# Patient Record
Sex: Female | Born: 1990 | Hispanic: No | Marital: Single | Smoking: Never smoker
Health system: Southern US, Community
[De-identification: ages and names within clinical notes are randomized; demographics above are authoritative.]

## PROBLEM LIST (undated history)

## (undated) ENCOUNTER — Inpatient Hospital Stay (HOSPITAL_COMMUNITY): Payer: Self-pay

## (undated) DIAGNOSIS — M419 Scoliosis, unspecified: Secondary | ICD-10-CM

## (undated) DIAGNOSIS — R030 Elevated blood-pressure reading, without diagnosis of hypertension: Secondary | ICD-10-CM

## (undated) DIAGNOSIS — Z8619 Personal history of other infectious and parasitic diseases: Secondary | ICD-10-CM

## (undated) DIAGNOSIS — O09899 Supervision of other high risk pregnancies, unspecified trimester: Secondary | ICD-10-CM

## (undated) DIAGNOSIS — Z87440 Personal history of urinary (tract) infections: Secondary | ICD-10-CM

## (undated) DIAGNOSIS — A749 Chlamydial infection, unspecified: Secondary | ICD-10-CM

## (undated) HISTORY — PX: DILATION AND CURETTAGE OF UTERUS: SHX78

---
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
## 2013-03-31 ENCOUNTER — Encounter (HOSPITAL_COMMUNITY): Payer: Self-pay | Admitting: Emergency Medicine

## 2013-03-31 ENCOUNTER — Emergency Department (INDEPENDENT_AMBULATORY_CARE_PROVIDER_SITE_OTHER)
Admission: EM | Admit: 2013-03-31 | Discharge: 2013-03-31 | Disposition: A | Discharge status: Home / Self Care | Payer: Medicaid Other | Source: Home / Self Care | Attending: Family Medicine | Admitting: Family Medicine

## 2013-03-31 DIAGNOSIS — IMO0001 Reserved for inherently not codable concepts without codable children: Secondary | ICD-10-CM

## 2013-03-31 DIAGNOSIS — L500 Allergic urticaria: Secondary | ICD-10-CM

## 2013-03-31 MED ORDER — HYDROXYZINE HCL 25 MG PO TABS: 25.0000 mg | ORAL_TABLET | Freq: Four times a day (QID) | ORAL | Status: DC

## 2013-03-31 MED ORDER — MOMETASONE FUROATE 0.1 % EX CREA: TOPICAL_CREAM | Freq: Every day | CUTANEOUS | Status: DC

## 2013-03-31 NOTE — ED Notes (Signed)
Pt c/o face swelling around eyes onset last night... Believes it may have been due to insect bite Sx include: itching Denies: difficult breathing, fevers

## 2013-03-31 NOTE — ED Provider Notes (Addendum)
History     CSN: [id number]  Arrival date & time 03/31/13  1149   First MD Initiated Contact with Patient 03/31/13 1258      Chief Complaint  Patient presents with  . Facial Swelling    (Consider location/radiation/quality/duration/timing/severity/associated sxs/prior treatment) Patient is a 22 y.o. female presenting with rash. The history is provided by the patient.  Rash Pain severity:  No pain Duration:  6 hours Progression:  Unchanged Chronicity:  Recurrent Context comment:  Over past sev weeks has had sporadic areas of rash swelling,   Past Medical History  Diagnosis Date  . Chlamydia   . Scoliosis     mild  . History of GBS (group B streptococcus) UTI, currently pregnant     History reviewed. No pertinent past surgical history.  No family history on file.  History  Substance Use Topics  . Smoking status: Never Smoker   . Smokeless tobacco: Not on file  . Alcohol Use: No    OB History   Grav Para Term Preterm Abortions TAB SAB Ect Mult Living   2 1 1  1 1    1       Review of Systems  Constitutional: Negative.   HENT: Positive for facial swelling. Negative for congestion, rhinorrhea and postnasal drip.   Eyes: Negative.   Skin: Positive for rash.    Allergies  Review of patient's allergies indicates no known allergies.  Home Medications   Current Outpatient Rx  Name  Route  Sig  Dispense  Refill  . hydrOXYzine (ATARAX/VISTARIL) 25 MG tablet   Oral   Take 1 tablet (25 mg total) by mouth every 6 (six) hours. For itching rash swelling.   20 tablet   0   . mometasone (ELOCON) 0.1 % cream   Topical   Apply topically daily.   45 g   0   . prenatal vitamin w/FE, FA (PRENATAL 1 + 1) 27-1 MG TABS   Oral   Take 1 tablet by mouth daily.             BP 129/84  Pulse 62  Temp(Src) 98.1 F (36.7 C) (Oral)  Resp 16  SpO2 100%  LMP 03/26/2013  Breastfeeding? No  Physical Exam  Nursing note and vitals reviewed. Constitutional: She is  oriented to person, place, and time. She appears well-developed and well-nourished.  HENT:  Head: Normocephalic.  Right Ear: External ear normal.  Left Ear: External ear normal.  Mouth/Throat: Oropharynx is clear and moist.  Eyes: Conjunctivae are normal. Pupils are equal, round, and reactive to light.  Neck: Normal range of motion. Neck supple.  Neurological: She is alert and oriented to person, place, and time.  Skin: Skin is warm and dry. No rash noted.  No rash swelling evident.  Psychiatric: She has a normal mood and affect.    ED Course  Procedures (including critical care time)  Labs Reviewed - No data to display No results found.   1. Allergy, urticaria       MDM          Linna Hoff, MD 03/31/13 1313  Linna Hoff, MD 03/31/13 2000

## 2013-08-02 HISTORY — PX: NASAL SINUS SURGERY: SHX719

## 2013-10-14 ENCOUNTER — Encounter (HOSPITAL_COMMUNITY): Payer: Self-pay | Admitting: Emergency Medicine

## 2013-10-14 ENCOUNTER — Emergency Department (HOSPITAL_COMMUNITY): Payer: Medicaid Other

## 2013-10-14 ENCOUNTER — Emergency Department (HOSPITAL_COMMUNITY)
Admission: EM | Admit: 2013-10-14 | Discharge: 2013-10-14 | Disposition: A | Discharge status: Home / Self Care | Payer: Medicaid Other | Attending: Emergency Medicine | Admitting: Emergency Medicine

## 2013-10-14 DIAGNOSIS — R059 Cough, unspecified: Secondary | ICD-10-CM | POA: Insufficient documentation

## 2013-10-14 DIAGNOSIS — R350 Frequency of micturition: Secondary | ICD-10-CM | POA: Insufficient documentation

## 2013-10-14 DIAGNOSIS — Z79899 Other long term (current) drug therapy: Secondary | ICD-10-CM | POA: Insufficient documentation

## 2013-10-14 DIAGNOSIS — Z8619 Personal history of other infectious and parasitic diseases: Secondary | ICD-10-CM | POA: Insufficient documentation

## 2013-10-14 DIAGNOSIS — Z8739 Personal history of other diseases of the musculoskeletal system and connective tissue: Secondary | ICD-10-CM | POA: Insufficient documentation

## 2013-10-14 DIAGNOSIS — Z87440 Personal history of urinary (tract) infections: Secondary | ICD-10-CM | POA: Insufficient documentation

## 2013-10-14 DIAGNOSIS — R05 Cough: Secondary | ICD-10-CM | POA: Insufficient documentation

## 2013-10-14 DIAGNOSIS — J029 Acute pharyngitis, unspecified: Secondary | ICD-10-CM | POA: Insufficient documentation

## 2013-10-14 MED ORDER — BENZONATATE 100 MG PO CAPS: 100.0000 mg | ORAL_CAPSULE | Freq: Three times a day (TID) | ORAL | Status: DC

## 2013-10-14 MED ORDER — LORATADINE 10 MG PO TABS: 10.0000 mg | ORAL_TABLET | Freq: Every day | ORAL | Status: DC

## 2013-10-14 NOTE — ED Notes (Signed)
Pt reports having a cough for one month. Pt states that sometimes a clear mucus will come up. Pt states the cough is worse at night and "sometimes can not stop coughing." Pt is A/O x4, in NAD, and vital signs are WDL.

## 2013-10-14 NOTE — ED Provider Notes (Signed)
CSN: [id number]     Arrival date & time 10/14/13  1154 History  This chart was scribed for non-physician practitioner working with Shanna Cisco, MD by Ashley Jacobs, ED scribe. This patient was seen in room WA10/WA10 and the patient's care was started at 12:32 PM.   First MD Initiated Contact with Patient 10/14/13 1212     Chief Complaint  Patient presents with  . Cough   (Consider location/radiation/quality/duration/timing/severity/associated sxs/prior Treatment) Patient is a 22 y.o. female presenting with cough. The history is provided by the patient and medical records. No language interpreter was used.  Cough Cough characteristics:  Dry Severity:  Mild Onset quality:  Sudden Duration:  31 days Timing:  Constant Progression:  Unchanged Chronicity:  New Smoker: no   Context: upper respiratory infection   Context: not sick contacts   Relieved by:  Nothing Ineffective treatments:  Cough suppressants Associated symptoms: sore throat   Associated symptoms: no chills, no fever and no rhinorrhea   Risk factors: recent infection   Risk factors: no recent travel    HPI Comments: Ann Parsons is a 22 y.o. female who presents to the Emergency Department complaining of a persistent dry cough for the past month. When she coughs she experiences urinary frequency.The cough is worse at night and cause her to have throat soreness. The throat soreness is described as a "burning sensation". She initially had congestion but denies the symptoms presently. Denies chest pain. Pt does not have any known allergies to medications. She has tried OTC generic cough medicine for symptoms with minimal relief. Pt denies recent travel. She denies nausea, vomiting, and diarrhea. Pt have any known allergies to medications.   Past Medical History  Diagnosis Date  . Chlamydia   . Scoliosis     mild  . History of GBS (group B streptococcus) UTI, currently pregnant    History reviewed. No pertinent past  surgical history. No family history on file. History  Substance Use Topics  . Smoking status: Never Smoker   . Smokeless tobacco: Never Used  . Alcohol Use: No   OB History   Grav Para Term Preterm Abortions TAB SAB Ect Mult Living   2 1 1  1 1    1      Review of Systems  Constitutional: Negative for fever and chills.  HENT: Positive for sore throat. Negative for congestion and rhinorrhea.   Respiratory: Positive for cough.   Gastrointestinal: Negative for nausea, vomiting and diarrhea.  All other systems reviewed and are negative.    Allergies  Review of patient's allergies indicates no known allergies.  Home Medications   Current Outpatient Rx  Name  Route  Sig  Dispense  Refill  . ibuprofen (ADVIL,MOTRIN) 200 MG tablet   Oral   Take 600 mg by mouth every 6 (six) hours as needed.         . benzonatate (TESSALON) 100 MG capsule   Oral   Take 1 capsule (100 mg total) by mouth every 8 (eight) hours.   21 capsule   0   . loratadine (CLARITIN) 10 MG tablet   Oral   Take 1 tablet (10 mg total) by mouth daily.   30 tablet   0    BP 129/70  Pulse 84  Temp(Src) 98.1 F (36.7 C) (Oral)  Resp 18  SpO2 100%  LMP 09/06/2013 Physical Exam  Nursing note and vitals reviewed. Constitutional: She appears well-developed and well-nourished. No distress.  Pt lying comfortably in  exam bed, NAD.   HENT:  Head: Normocephalic and atraumatic.  Right Ear: Hearing, tympanic membrane, external ear and ear canal normal.  Left Ear: Hearing, tympanic membrane, external ear and ear canal normal.  Nose: Nose normal.  Mouth/Throat: Uvula is midline, oropharynx is clear and moist and mucous membranes are normal.  Eyes: Conjunctivae are normal. No scleral icterus.  Neck: Normal range of motion.  Cardiovascular: Normal rate, regular rhythm and normal heart sounds.   Pulmonary/Chest: Effort normal and breath sounds normal. No respiratory distress. She has no wheezes. She has no rales.  She exhibits no tenderness.  No respiratory distress, able to speak in full sentences w/o difficulty. Lungs: CTAB   Abdominal: Soft. Bowel sounds are normal. She exhibits no distension and no mass. There is no tenderness. There is no rebound and no guarding.  Musculoskeletal: Normal range of motion.  Neurological: She is alert.  Skin: Skin is warm and dry. She is not diaphoretic.    ED Course  Procedures (including critical care time) DIAGNOSTIC STUDIES: Oxygen Saturation is 100% on room air, normal by my interpretation.    COORDINATION OF CARE: 12:36 PM Discussed course of care with pt . Pt understands and agrees.  Labs Review Labs Reviewed - No data to display Imaging Review Dg Chest 2 View  10/14/2013   CLINICAL DATA:  Shortness of breath  EXAM: CHEST  2 VIEW  COMPARISON:  None.  FINDINGS: The heart size and mediastinal contours are within normal limits. Both lungs are clear. The visualized skeletal structures are unremarkable.  IMPRESSION: No active cardiopulmonary disease.   Electronically Signed   By: Alcide Clever M.D.   On: 10/14/2013 12:25    EKG Interpretation   None       MDM   1. Cough    Pt is a [place] female with hx of URI with congestion 31mo ago c/o persistent dry cough. Denies fever, n/v/d. Pt is not on daily medications such as an ACEi.  Vitals: WNL, O2-100%.  Denies chest pain.  CXR: unremarkable, no signs of pneumonia or bronchitis. PERC negative.  Not concerned for ACS. Rx: tessalon pearls, advised to f/u with PCP next week if not improving. Return precautions provided. Pt verbalized understanding and agreement with tx plan.   I personally performed the services described in this documentation, which was scribed in my presence. The recorded information has been reviewed and is accurate.    Junius Finner, PA-C 10/14/13 1311

## 2013-10-14 NOTE — ED Provider Notes (Signed)
Medical screening examination/treatment/procedure(s) were performed by non-physician practitioner and as supervising physician I was immediately available for consultation/collaboration.  Shanna Cisco, MD 10/14/13 [phone]

## 2013-11-26 ENCOUNTER — Emergency Department (HOSPITAL_COMMUNITY): Payer: Medicaid Other

## 2013-11-26 ENCOUNTER — Encounter (HOSPITAL_COMMUNITY): Payer: Self-pay | Admitting: Emergency Medicine

## 2013-11-26 ENCOUNTER — Emergency Department (HOSPITAL_COMMUNITY)
Admission: EM | Admit: 2013-11-26 | Discharge: 2013-11-26 | Disposition: A | Discharge status: Home / Self Care | Payer: Medicaid Other | Attending: Emergency Medicine | Admitting: Emergency Medicine

## 2013-11-26 DIAGNOSIS — K044 Acute apical periodontitis of pulpal origin: Secondary | ICD-10-CM | POA: Insufficient documentation

## 2013-11-26 DIAGNOSIS — Z8619 Personal history of other infectious and parasitic diseases: Secondary | ICD-10-CM | POA: Insufficient documentation

## 2013-11-26 DIAGNOSIS — Z79899 Other long term (current) drug therapy: Secondary | ICD-10-CM | POA: Insufficient documentation

## 2013-11-26 DIAGNOSIS — Z87440 Personal history of urinary (tract) infections: Secondary | ICD-10-CM | POA: Insufficient documentation

## 2013-11-26 DIAGNOSIS — K0889 Other specified disorders of teeth and supporting structures: Secondary | ICD-10-CM

## 2013-11-26 DIAGNOSIS — K047 Periapical abscess without sinus: Secondary | ICD-10-CM

## 2013-11-26 DIAGNOSIS — Z8739 Personal history of other diseases of the musculoskeletal system and connective tissue: Secondary | ICD-10-CM | POA: Insufficient documentation

## 2013-11-26 MED ORDER — ONDANSETRON 4 MG PO TBDP
4.0000 mg | ORAL_TABLET | Freq: Once | ORAL | Status: AC
  Administered 2013-11-26: 4 mg via ORAL
  Filled 2013-11-26: qty 1

## 2013-11-26 MED ORDER — AMOXICILLIN 500 MG PO CAPS
500.0000 mg | ORAL_CAPSULE | Freq: Three times a day (TID) | ORAL | Status: DC
Start: 2013-11-26 — End: 2014-10-05

## 2013-11-26 MED ORDER — OXYCODONE-ACETAMINOPHEN 5-325 MG PO TABS
2.0000 | ORAL_TABLET | Freq: Once | ORAL | Status: AC
  Administered 2013-11-26: 2 via ORAL
  Filled 2013-11-26: qty 2

## 2013-11-26 MED ORDER — OXYCODONE-ACETAMINOPHEN 5-325 MG PO TABS: 1.0000 | ORAL_TABLET | ORAL | Status: DC | PRN

## 2013-11-26 NOTE — Discharge Instructions (Signed)
You have a dental infection Use the resource guide listed below to help you find a dentist if you do not already have one to followup with. It is very important that you get evaluated by a dentist as soon as possible. Call tomorrow to schedule an appointment. Use your pain medication as prescribed and do not operate heavy machinery while on pain medication. Note that your pain medication contains acetaminophen (Tylenol) & its is not reccommended that you use additional acetaminophen (Tylenol) while taking this medication. Take your full course of antibiotics. Read the instructions below.  Eat a soft or liquid diet and rinse your mouth out after meals with warm water. You should see a dentist or return here at once if you have increased swelling, increased pain or uncontrolled bleeding from the site of your injury.   SEEK MEDICAL CARE IF:   You have increased pain not controlled with medicines.   You have swelling around your tooth, in your face or neck.   You have bleeding which starts, continues, or gets worse.   You have a fever >101  If you are unable to open your mouth  RESOURCE GUIDE  Dental Problems  Patients with Medicaid: [facility]                     [facility] [phone]                                           [phone] [address] Phone:  [phone]                                                  Phone:  [phone]  If unable to pay or uninsured, contact:  [facility] or [facility] to become qualified for the adult dental clinic.  Chronic Pain Problems Contact [facility]  [phone] Patients need to be referred by their primary care doctor.  Insufficient Money for Medicine Contact [employer] Way:  call "211" or [facility] [phone].  No Primary Care Doctor Call [facility]  [phone] Other agencies that provide inexpensive medical care    [facility]  [phone]    [facility]  [phone]    [facility]  [phone]    [facility]  [phone]    [facility]  [phone]    [facility]  [phone]  Psychological Services [facility]  [phone] [facility]  [phone] [facility]   [phone] (emergency services [phone])  Substance Abuse Resources [facility]  [phone] [facility] [phone] [facility] [phone] [facility] [phone] Residential & Outpatient Substance Abuse Program  [phone]  Abuse/Neglect [facility] Hotline [phone] [facility] [phone] (After Hours)  Emergency Shelter [facility] [phone]  [facility] [place] [facility] [phone] [facility] [phone]  MRSA Hotline #:   [phone]    [facility]  [facility]     [employer]  [facility] °[address] [place]                       [address]      [address]  °[place]                                                [place]                            [place] °Phone:  [phone]                                   Phone:  [phone]                 Phone:  [phone] ° °[facility] °Phone:  342-8316 ° °[place] Child Abuse Hotline °(336) 342-1394 °[phone] (After Hours) ° ° ° ° °

## 2013-11-26 NOTE — ED Provider Notes (Signed)
CSN: [id number]     Arrival date & time 11/26/13  1048 History   First MD Initiated Contact with Patient 11/26/13 1052     Chief Complaint  Patient presents with  . Abscess   (Consider location/radiation/quality/duration/timing/severity/associated sxs/prior Treatment) HPI  Patient is a 23 year old female who presents today complaining of dental pain x 3 days.  She states that she has had 3-4 toothaches per day for the last month, but this one is more severe.  She has also noticed lower left-sided facial swelling that has gradually worsened.  She describes the pain as localized to the lower left side and throbbing, worse at night and with eating.  She has tried OTC ibuprofen and ice to manage her pain without relief and rates her pain 10/10 at this time.  Patient admits facial pain, facial swelling, dental pain, and pain with eating.  Denies fever/chills, sore throat, difficulty breathing, or difficulty swallowing.  Patient was seen by her regular dentist last month and has another appointment this upcoming Wednesday (1/28).  Past Medical History  Diagnosis Date  . Chlamydia   . Scoliosis     mild  . History of GBS (group B streptococcus) UTI, currently pregnant    No past surgical history on file. No family history on file. History  Substance Use Topics  . Smoking status: Never Smoker   . Smokeless tobacco: Never Used  . Alcohol Use: No   OB History   Grav Para Term Preterm Abortions TAB SAB Ect Mult Living   2 1 1  1 1    1      Review of Systems  Constitutional: Negative for fever and chills.  HENT: Positive for dental problem and facial swelling. Negative for sore throat, trouble swallowing and voice change.   Respiratory: Negative for choking, shortness of breath and stridor.   Cardiovascular: Negative for chest pain.  Gastrointestinal: Negative for nausea, vomiting, abdominal pain, diarrhea and constipation.  Genitourinary: Negative for dysuria and hematuria.    Musculoskeletal: Negative for arthralgias and myalgias.  Skin: Negative for rash.  Neurological: Negative for numbness.  All other systems reviewed and are negative.    Allergies  Review of patient's allergies indicates no known allergies.  Home Medications   Current Outpatient Rx  Name  Route  Sig  Dispense  Refill  . ibuprofen (ADVIL,MOTRIN) 200 MG tablet   Oral   Take 600 mg by mouth every 6 (six) hours as needed.         . loratadine (CLARITIN) 10 MG tablet   Oral   Take 1 tablet (10 mg total) by mouth daily.   30 tablet   0    BP 141/93  Pulse 94  Temp(Src) 99.6 F (37.6 C) (Oral)  SpO2 98% Physical Exam  Constitutional: She is oriented to person, place, and time. She appears well-developed and well-nourished. No distress.  HENT:  Head: Normocephalic and atraumatic.    Mouth/Throat: Uvula is midline and oropharynx is clear and moist. No trismus in the jaw.    Eyes: Conjunctivae are normal. No scleral icterus.  Neck: Normal range of motion.  Cardiovascular: Normal rate, regular rhythm and normal heart sounds.  Exam reveals no gallop and no friction rub.   No murmur heard. Pulmonary/Chest: Effort normal and breath sounds normal. No respiratory distress.  Abdominal: Soft. Bowel sounds are normal. She exhibits no distension and no mass. There is no tenderness. There is no guarding.  Neurological: She is alert and oriented to person, place,  and time.  Skin: Skin is warm and dry. She is not diaphoretic.    ED Course  Procedures (including critical care time) Labs Review Labs Reviewed - No data to display Imaging Review No results found.  EKG Interpretation   None       MDM   1. Dental infection   2. Pain, dental    Patient with toothache.  Likely developing abscess. Orthopantogram negative.   Exam unconcerning for Ludwig's angina or spread of infection.  Will treat with penicillin and pain medicine.  Patient has f/u appointment this week with her  dentist   Arthor Captainbigail Kian Ottaviano, PA-C 11/26/13 1302

## 2013-11-26 NOTE — ED Notes (Signed)
Pt c/o dental abscess and facial swelling since Friday.

## 2013-12-01 NOTE — ED Provider Notes (Signed)
Medical screening examination/treatment/procedure(s) were performed by non-physician practitioner and as supervising physician I was immediately available for consultation/collaboration.  Carli Lefevers T Ala Kratz, MD 12/01/13 0935 

## 2014-09-03 ENCOUNTER — Encounter (HOSPITAL_COMMUNITY): Payer: Self-pay | Admitting: Emergency Medicine

## 2014-10-05 ENCOUNTER — Encounter: Payer: Self-pay | Admitting: Obstetrics & Gynecology

## 2014-10-05 ENCOUNTER — Ambulatory Visit (INDEPENDENT_AMBULATORY_CARE_PROVIDER_SITE_OTHER): Payer: Medicaid Other | Admitting: Obstetrics & Gynecology

## 2014-10-05 VITALS — BP 117/77 | HR 72 | Temp 98.2°F | Ht 63.0 in | Wt 147.1 lb

## 2014-10-05 DIAGNOSIS — Z30432 Encounter for removal of intrauterine contraceptive device: Secondary | ICD-10-CM

## 2014-10-05 DIAGNOSIS — N921 Excessive and frequent menstruation with irregular cycle: Secondary | ICD-10-CM

## 2014-10-05 DIAGNOSIS — Z975 Presence of (intrauterine) contraceptive device: Principal | ICD-10-CM

## 2014-10-05 HISTORY — DX: Excessive and frequent menstruation with irregular cycle: N92.1

## 2014-10-05 NOTE — Patient Instructions (Signed)
Contraception Choices Contraception (birth control) is the use of any methods or devices to prevent pregnancy. Below are some methods to help avoid pregnancy. HORMONAL METHODS   Contraceptive implant. This is a thin, plastic tube containing progesterone hormone. It does not contain estrogen hormone. Your health care provider inserts the tube in the inner part of the upper arm. The tube can remain in place for up to 3 years. After 3 years, the implant must be removed. The implant prevents the ovaries from releasing an egg (ovulation), thickens the cervical mucus to prevent sperm from entering the uterus, and thins the lining of the inside of the uterus.  Progesterone-only injections. These injections are given every 3 months by your health care provider to prevent pregnancy. This synthetic progesterone hormone stops the ovaries from releasing eggs. It also thickens cervical mucus and changes the uterine lining. This makes it harder for sperm to survive in the uterus.  Birth control pills. These pills contain estrogen and progesterone hormone. They work by preventing the ovaries from releasing eggs (ovulation). They also cause the cervical mucus to thicken, preventing the sperm from entering the uterus. Birth control pills are prescribed by a health care provider.Birth control pills can also be used to treat heavy periods.  Minipill. This type of birth control pill contains only the progesterone hormone. They are taken every day of each month and must be prescribed by your health care provider.  Birth control patch. The patch contains hormones similar to those in birth control pills. It must be changed once a week and is prescribed by a health care provider.  Vaginal ring. The ring contains hormones similar to those in birth control pills. It is left in the vagina for 3 weeks, removed for 1 week, and then a new one is put back in place. The patient must be comfortable inserting and removing the ring  from the vagina.A health care provider's prescription is necessary.  Emergency contraception. Emergency contraceptives prevent pregnancy after unprotected sexual intercourse. This pill can be taken right after sex or up to 5 days after unprotected sex. It is most effective the sooner you take the pills after having sexual intercourse. Most emergency contraceptive pills are available without a prescription. Check with your pharmacist. Do not use emergency contraception as your only form of birth control. BARRIER METHODS   Female condom. This is a thin sheath (latex or rubber) that is worn over the penis during sexual intercourse. It can be used with spermicide to increase effectiveness.  Female condom. This is a soft, loose-fitting sheath that is put into the vagina before sexual intercourse.  Diaphragm. This is a soft, latex, dome-shaped barrier that must be fitted by a health care provider. It is inserted into the vagina, along with a spermicidal jelly. It is inserted before intercourse. The diaphragm should be left in the vagina for 6 to 8 hours after intercourse.  Cervical cap. This is a round, soft, latex or plastic cup that fits over the cervix and must be fitted by a health care provider. The cap can be left in place for up to 48 hours after intercourse.  Sponge. This is a soft, circular piece of polyurethane foam. The sponge has spermicide in it. It is inserted into the vagina after wetting it and before sexual intercourse.  Spermicides. These are chemicals that kill or block sperm from entering the cervix and uterus. They come in the form of creams, jellies, suppositories, foam, or tablets. They do not require a   prescription. They are inserted into the vagina with an applicator before having sexual intercourse. The process must be repeated every time you have sexual intercourse. INTRAUTERINE CONTRACEPTION  Intrauterine device (IUD). This is a T-shaped device that is put in a woman's uterus  during a menstrual period to prevent pregnancy. There are 2 types:  Copper IUD. This type of IUD is wrapped in copper wire and is placed inside the uterus. Copper makes the uterus and fallopian tubes produce a fluid that kills sperm. It can stay in place for 10 years.  Hormone IUD. This type of IUD contains the hormone progestin (synthetic progesterone). The hormone thickens the cervical mucus and prevents sperm from entering the uterus, and it also thins the uterine lining to prevent implantation of a fertilized egg. The hormone can weaken or kill the sperm that get into the uterus. It can stay in place for 3-5 years, depending on which type of IUD is used. PERMANENT METHODS OF CONTRACEPTION  Female tubal ligation. This is when the woman's fallopian tubes are surgically sealed, tied, or blocked to prevent the egg from traveling to the uterus.  Hysteroscopic sterilization. This involves placing a small coil or insert into each fallopian tube. Your doctor uses a technique called hysteroscopy to do the procedure. The device causes scar tissue to form. This results in permanent blockage of the fallopian tubes, so the sperm cannot fertilize the egg. It takes about 3 months after the procedure for the tubes to become blocked. You must use another form of birth control for these 3 months.  Female sterilization. This is when the female has the tubes that carry sperm tied off (vasectomy).This blocks sperm from entering the vagina during sexual intercourse. After the procedure, the man can still ejaculate fluid (semen). NATURAL PLANNING METHODS  Natural family planning. This is not having sexual intercourse or using a barrier method (condom, diaphragm, cervical cap) on days the woman could become pregnant.  Calendar method. This is keeping track of the length of each menstrual cycle and identifying when you are fertile.  Ovulation method. This is avoiding sexual intercourse during ovulation.  Symptothermal  method. This is avoiding sexual intercourse during ovulation, using a thermometer and ovulation symptoms.  Post-ovulation method. This is timing sexual intercourse after you have ovulated. Regardless of which type or method of contraception you choose, it is important that you use condoms to protect against the transmission of sexually transmitted infections (STIs). Talk with your health care provider about which form of contraception is most appropriate for you. Document Released: 10/19/2005 Document Revised: 10/24/2013 Document Reviewed: 04/13/2013 ExitCare Patient Information 2015 [employer]. This information is not intended to replace advice given to you by your health care provider. Make sure you discuss any questions you have with your health care provider.  

## 2014-10-05 NOTE — Progress Notes (Signed)
Since July hasn't had a regular period. Also c/o pain and iud being uncomfortable.

## 2014-10-05 NOTE — Progress Notes (Signed)
Patient ID: Ann Parsons, female   DOB: Jul 09, 1991, 23 y.o.   MRN: [id number]  Chief Complaint  Patient presents with  . IUD complications    HPI Ann Parsons is a 23 y.o. female.  Mirena placed 11/2011, now with annoying BTB and cramps. Not sure what [facility]. Wants device removed  HPI  Past Medical History  Diagnosis Date  . Chlamydia   . Scoliosis     mild  . History of GBS (group B streptococcus) UTI, currently pregnant     Past Surgical History  Procedure Laterality Date  . Nasal sinus surgery  08/2013    Family History  Problem Relation Age of Onset  . Kidney disease Mother   . Lupus Mother   . Stroke Father     Social History History  Substance Use Topics  . Smoking status: Never Smoker   . Smokeless tobacco: Never Used  . Alcohol Use: Yes     Comment: occasionally    No Known Allergies  Current Outpatient Prescriptions  Medication Sig Dispense Refill  . ibuprofen (ADVIL,MOTRIN) 200 MG tablet Take 600 mg by mouth every 6 (six) hours as needed.    . loratadine (CLARITIN) 10 MG tablet Take 1 tablet (10 mg total) by mouth daily. (Patient not taking: Reported on 10/05/2014) 30 tablet 0   No current facility-administered medications for this visit.    Review of Systems Review of Systems  Constitutional: Negative.   Gastrointestinal: Negative.   Genitourinary: Positive for menstrual problem and pelvic pain. Negative for vaginal discharge.    Blood pressure 117/77, pulse 72, temperature 98.2 F (36.8 C), height 5\' 3"  (1.6 m), weight 147 lb 1.6 oz (66.724 kg).  Physical Exam Physical Exam  Constitutional: She appears well-developed. No distress.  Abdominal: Soft.  Genitourinary: Vagina normal. No vaginal discharge found.  Skin: Skin is warm and dry.  Psychiatric: She has a normal mood and affect. Her behavior is normal.   String grasped and removed intact Data Reviewed   Assessment    IUD BTB     Plan    Report if she decides on  a Rx BCM.Abstain or use condoms       ARNOLD,JAMES 10/05/2014, 8:39 AM

## 2014-10-18 ENCOUNTER — Encounter: Payer: Self-pay | Admitting: *Deleted

## 2015-09-10 LAB — OB RESULTS CONSOLE GC/CHLAMYDIA
Chlamydia: NEGATIVE
GC PROBE AMP, GENITAL: NEGATIVE

## 2015-09-10 LAB — OB RESULTS CONSOLE RPR: RPR: NONREACTIVE

## 2015-09-10 LAB — OB RESULTS CONSOLE ABO/RH: RH Type: NEGATIVE

## 2015-09-10 LAB — OB RESULTS CONSOLE HEPATITIS B SURFACE ANTIGEN: Hepatitis B Surface Ag: NEGATIVE

## 2015-09-10 LAB — OB RESULTS CONSOLE RUBELLA ANTIBODY, IGM: RUBELLA: IMMUNE

## 2015-09-10 LAB — OB RESULTS CONSOLE HIV ANTIBODY (ROUTINE TESTING): HIV: NONREACTIVE

## 2015-09-10 LAB — OB RESULTS CONSOLE ANTIBODY SCREEN: ANTIBODY SCREEN: NEGATIVE

## 2015-11-03 NOTE — L&D Delivery Note (Signed)
Delivery Note At 1:15 PM a viable female was delivered via Vaginal, Spontaneous Delivery (Presentation: Left Occiput Posterior).  APGAR: 9, 9; weight pending.   Placenta status: Intact, Spontaneous.  Cord:  with the following complications: None.   Anesthesia: Epidural  Episiotomy: None Lacerations: None Suture Repair: none Est. Blood Loss (mL): 200  Mom to postpartum.  Baby to Couplet care / Skin to Skin.  Cataleia Gade D 04/03/2016, 1:27 PM

## 2015-11-04 ENCOUNTER — Encounter (HOSPITAL_COMMUNITY): Payer: Self-pay | Admitting: *Deleted

## 2015-11-04 ENCOUNTER — Inpatient Hospital Stay (HOSPITAL_COMMUNITY)
Admission: AD | Admit: 2015-11-04 | Discharge: 2015-11-04 | Disposition: A | Discharge status: Home / Self Care | Payer: Medicaid Other | Source: Ambulatory Visit | Attending: Obstetrics and Gynecology | Admitting: Obstetrics and Gynecology

## 2015-11-04 DIAGNOSIS — M419 Scoliosis, unspecified: Secondary | ICD-10-CM | POA: Insufficient documentation

## 2015-11-04 DIAGNOSIS — O99612 Diseases of the digestive system complicating pregnancy, second trimester: Secondary | ICD-10-CM | POA: Diagnosis not present

## 2015-11-04 DIAGNOSIS — Z3A17 17 weeks gestation of pregnancy: Secondary | ICD-10-CM | POA: Diagnosis not present

## 2015-11-04 DIAGNOSIS — R109 Unspecified abdominal pain: Secondary | ICD-10-CM | POA: Diagnosis present

## 2015-11-04 DIAGNOSIS — Z7289 Other problems related to lifestyle: Secondary | ICD-10-CM | POA: Diagnosis not present

## 2015-11-04 DIAGNOSIS — O211 Hyperemesis gravidarum with metabolic disturbance: Secondary | ICD-10-CM | POA: Diagnosis not present

## 2015-11-04 DIAGNOSIS — K529 Noninfective gastroenteritis and colitis, unspecified: Secondary | ICD-10-CM | POA: Diagnosis not present

## 2015-11-04 DIAGNOSIS — A749 Chlamydial infection, unspecified: Secondary | ICD-10-CM | POA: Insufficient documentation

## 2015-11-04 LAB — URINALYSIS, ROUTINE W REFLEX MICROSCOPIC
Bilirubin Urine: NEGATIVE
GLUCOSE, UA: NEGATIVE mg/dL
Hgb urine dipstick: NEGATIVE
Ketones, ur: 15 mg/dL — AB
LEUKOCYTES UA: NEGATIVE
NITRITE: NEGATIVE
PROTEIN: 30 mg/dL — AB
Specific Gravity, Urine: 1.02 (ref 1.005–1.030)
pH: 6 (ref 5.0–8.0)

## 2015-11-04 LAB — URINE MICROSCOPIC-ADD ON
BACTERIA UA: NONE SEEN
RBC / HPF: NONE SEEN RBC/hpf (ref 0–5)

## 2015-11-04 MED ORDER — ONDANSETRON 4 MG PO TBDP: 4.0000 mg | ORAL_TABLET | Freq: Three times a day (TID) | ORAL | Status: DC | PRN

## 2015-11-04 MED ORDER — PROMETHAZINE HCL 25 MG RE SUPP: 25.0000 mg | Freq: Four times a day (QID) | RECTAL | Status: DC | PRN

## 2015-11-04 MED ORDER — ONDANSETRON 8 MG PO TBDP
8.0000 mg | ORAL_TABLET | Freq: Once | ORAL | Status: AC
  Administered 2015-11-04: 8 mg via ORAL
  Filled 2015-11-04: qty 1

## 2015-11-04 NOTE — MAU Note (Signed)
Pt states she woke up vomiting, continued for 4 hours.  Hasn't vomited in last 2 hours, but feel weak & is having lower abd pain.  Denies diarrhea.

## 2015-11-04 NOTE — MAU Note (Signed)
Patient given gingerale told to take sips only.

## 2015-11-04 NOTE — Discharge Instructions (Signed)

## 2015-11-04 NOTE — MAU Provider Note (Signed)
History     CSN: [id number]  Arrival date and time: 11/04/15 1122   First Provider Initiated Contact with Patient 11/04/15 1314      Chief Complaint  Patient presents with  . Emesis During Pregnancy   HPI   [name] is a 25 y.o. female G3P1011 at [redacted]w[redacted]d presenting to MAU with vomiting. The vomiting started abruptly this morning at 0600, she vomited 10 times, every 30 mins. She last vomited at 10:00 am. She has not had any vomiting in the last 3 hours. She denies diarrhea.   Patient has not taken anything for the nausea today. No one else in the house is sick.   She has occasional, mild lower abdominal cramping. She feels dehydrated.   OB History    Gravida Para Term Preterm AB TAB SAB Ectopic Multiple Living   3 1 1  1 1    1       Past Medical History  Diagnosis Date  . Chlamydia   . Scoliosis     mild  . History of GBS (group B streptococcus) UTI, currently pregnant     Past Surgical History  Procedure Laterality Date  . Nasal sinus surgery  08/2013    Family History  Problem Relation Age of Onset  . Kidney disease Mother   . Lupus Mother   . Stroke Father     Social History  Substance Use Topics  . Smoking status: Never Smoker   . Smokeless tobacco: Never Used  . Alcohol Use: Yes     Comment: occasionally    Allergies: No Known Allergies  Prescriptions prior to admission  Medication Sig Dispense Refill Last Dose  . ibuprofen (ADVIL,MOTRIN) 200 MG tablet Take 600 mg by mouth every 6 (six) hours as needed.   Taking  . loratadine (CLARITIN) 10 MG tablet Take 1 tablet (10 mg total) by mouth daily. (Patient not taking: Reported on 10/05/2014) 30 tablet 0 Not Taking   Results for orders placed or performed during the hospital encounter of 11/04/15 (from the past 48 hour(s))  Urinalysis, Routine w reflex microscopic (not at [facility])     Status: Abnormal   Collection Time: 11/04/15 12:03 PM  Result Value Ref Range   Color, Urine YELLOW YELLOW   APPearance CLEAR CLEAR   Specific Gravity, Urine 1.020 1.005 - 1.030   pH 6.0 5.0 - 8.0   Glucose, UA NEGATIVE NEGATIVE mg/dL   Hgb urine dipstick NEGATIVE NEGATIVE   Bilirubin Urine NEGATIVE NEGATIVE   Ketones, ur 15 (A) NEGATIVE mg/dL   Protein, ur 30 (A) NEGATIVE mg/dL   Nitrite NEGATIVE NEGATIVE   Leukocytes, UA NEGATIVE NEGATIVE  Urine microscopic-add on     Status: Abnormal   Collection Time: 11/04/15 12:03 PM  Result Value Ref Range   Squamous Epithelial / LPF 0-5 (A) NONE SEEN   WBC, UA 0-5 0 - 5 WBC/hpf   RBC / HPF NONE SEEN 0 - 5 RBC/hpf   Bacteria, UA NONE SEEN NONE SEEN   Urine-Other MUCOUS PRESENT     Review of Systems  Constitutional: Negative for fever and chills.  Gastrointestinal: Positive for nausea and abdominal pain. Negative for vomiting.  Neurological: Positive for weakness. Negative for dizziness.   Physical Exam   Blood pressure 120/67, pulse 87, temperature 97.8 F (36.6 C), temperature source Oral, resp. rate 16.  Physical Exam  Constitutional: She is oriented to person, place, and time. Vital signs are normal. She appears well-developed and well-nourished.  Non-toxic appearance.  She does not have a sickly appearance. She does not appear ill. No distress.  HENT:  Head: Normocephalic.  Eyes: Pupils are equal, round, and reactive to light.  GI: Soft.  Musculoskeletal: Normal range of motion.  Neurological: She is alert and oriented to person, place, and time.  Skin: Skin is warm. She is not diaphoretic.  Psychiatric: Her behavior is normal.    MAU Course  Procedures  none  MDM  + fetal heart tones via doppler.  Discussed patient with Dr. Jackelyn KnifeMeisinger who is agreeable to the plan of care.   Zofran 4 mg ODT given; patient tolerated PO fluids with vomiting.   Assessment and Plan   A:  1. Gastroenteritis, acute     P:  Discharge home in stable condition  RX: Zofran, phenergan Return to MAU if symptoms worsen Follow up with OB as  scheduled  B.R.A.T diet encouraged Increase PO fluids    Duane LopeJennifer I Khira Cudmore, NP 11/04/2015'1:31 PM

## 2016-02-25 ENCOUNTER — Inpatient Hospital Stay (HOSPITAL_COMMUNITY)
Admission: AD | Admit: 2016-02-25 | Discharge: 2016-02-25 | Disposition: A | Discharge status: Home / Self Care | Payer: Medicaid Other | Source: Ambulatory Visit | Attending: Obstetrics and Gynecology | Admitting: Obstetrics and Gynecology

## 2016-02-25 ENCOUNTER — Encounter (HOSPITAL_COMMUNITY): Payer: Self-pay

## 2016-02-25 DIAGNOSIS — R1030 Lower abdominal pain, unspecified: Secondary | ICD-10-CM | POA: Diagnosis present

## 2016-02-25 DIAGNOSIS — M419 Scoliosis, unspecified: Secondary | ICD-10-CM | POA: Insufficient documentation

## 2016-02-25 DIAGNOSIS — O26899 Other specified pregnancy related conditions, unspecified trimester: Secondary | ICD-10-CM

## 2016-02-25 DIAGNOSIS — O4703 False labor before 37 completed weeks of gestation, third trimester: Secondary | ICD-10-CM | POA: Diagnosis not present

## 2016-02-25 DIAGNOSIS — Z3A34 34 weeks gestation of pregnancy: Secondary | ICD-10-CM

## 2016-02-25 DIAGNOSIS — O479 False labor, unspecified: Secondary | ICD-10-CM

## 2016-02-25 DIAGNOSIS — R102 Pelvic and perineal pain: Secondary | ICD-10-CM | POA: Insufficient documentation

## 2016-02-25 DIAGNOSIS — O26893 Other specified pregnancy related conditions, third trimester: Secondary | ICD-10-CM | POA: Insufficient documentation

## 2016-02-25 DIAGNOSIS — R51 Headache: Secondary | ICD-10-CM | POA: Insufficient documentation

## 2016-02-25 DIAGNOSIS — Z3A33 33 weeks gestation of pregnancy: Secondary | ICD-10-CM | POA: Diagnosis not present

## 2016-02-25 LAB — URINALYSIS, ROUTINE W REFLEX MICROSCOPIC
BILIRUBIN URINE: NEGATIVE
GLUCOSE, UA: NEGATIVE mg/dL
HGB URINE DIPSTICK: NEGATIVE
KETONES UR: NEGATIVE mg/dL
Leukocytes, UA: NEGATIVE
Nitrite: NEGATIVE
PROTEIN: NEGATIVE mg/dL
Specific Gravity, Urine: 1.01 (ref 1.005–1.030)
pH: 6.5 (ref 5.0–8.0)

## 2016-02-25 NOTE — Discharge Instructions (Signed)
Preterm Labor Information °Preterm labor is when labor starts at less than 37 weeks of pregnancy. The normal length of a pregnancy is 39 to 41 weeks. °CAUSES °Often, there is no identifiable underlying cause as to why a woman goes into preterm labor. One of the most common known causes of preterm labor is infection. Infections of the uterus, cervix, vagina, amniotic sac, bladder, kidney, or even the lungs (pneumonia) can cause labor to start. Other suspected causes of preterm labor include:  °· Urogenital infections, such as yeast infections and bacterial vaginosis.   °· Uterine abnormalities (uterine shape, uterine septum, fibroids, or bleeding from the placenta).   °· A cervix that has been operated on (it may fail to stay closed).   °· Malformations in the fetus.   °· Multiple gestations (twins, triplets, and so on).   °· Breakage of the amniotic sac.   °RISK FACTORS °· Having a previous history of preterm labor.   °· Having premature rupture of membranes (PROM).   °· Having a placenta that covers the opening of the cervix (placenta previa).   °· Having a placenta that separates from the uterus (placental abruption).   °· Having a cervix that is too weak to hold the fetus in the uterus (incompetent cervix).   °· Having too much fluid in the amniotic sac (polyhydramnios).   °· Taking illegal drugs or smoking while pregnant.   °· Not gaining enough weight while pregnant.   °· Being younger than 18 and older than 23 years old.   °· Having a low socioeconomic status.   °· Being African American. °SYMPTOMS °Signs and symptoms of preterm labor include:  °· Menstrual-like cramps, abdominal pain, or back pain. °· Uterine contractions that are regular, as frequent as six in an hour, regardless of their intensity (may be mild or painful). °· Contractions that start on the top of the uterus and spread down to the lower abdomen and back.   °· A sense of increased pelvic pressure.   °· A watery or bloody mucus discharge that  comes from the vagina.   °TREATMENT °Depending on the length of the pregnancy and other circumstances, your health care provider may suggest bed rest. If necessary, there are medicines that can be given to stop contractions and to mature the fetal lungs. If labor happens before 34 weeks of pregnancy, a prolonged hospital stay may be recommended. Treatment depends on the condition of both you and the fetus.  °WHAT SHOULD YOU DO IF YOU THINK YOU ARE IN PRETERM LABOR? °Call your health care provider right away. You will need to go to the hospital to get checked immediately. °HOW CAN YOU PREVENT PRETERM LABOR IN FUTURE PREGNANCIES? °You should:  °· Stop smoking if you smoke.  °· Maintain healthy weight gain and avoid chemicals and drugs that are not necessary. °· Be watchful for any type of infection. °· Inform your health care provider if you have a known history of preterm labor. °  °This information is not intended to replace advice given to you by your health care provider. Make sure you discuss any questions you have with your health care provider. °  °Document Released: 01/09/2004 Document Revised: 06/21/2013 Document Reviewed: 11/21/2012 °Elsevier Interactive Patient Education ©2016 [employer] ° ° °Round Ligament Pain °The round ligament is a cord of muscle and tissue that helps to support the uterus. It can become a source of pain during pregnancy if it becomes stretched or twisted as the baby grows. The pain usually begins in the second trimester of pregnancy, and it can come and go until the baby is   delivered. It is not a serious problem, and it does not cause harm to the baby. °Round ligament pain is usually a short, sharp, and pinching pain, but it can also be a dull, lingering, and aching pain. The pain is felt in the lower side of the abdomen or in the groin. It usually starts deep in the groin and moves up to the outside of the hip area. Pain can occur with: °· A sudden change in position. °· Rolling  over in bed. °· Coughing or sneezing. °· Physical activity. °HOME CARE INSTRUCTIONS °Watch your condition for any changes. Take these steps to help with your pain: °· When the pain starts, relax. Then try: °· Sitting down. °· Flexing your knees up to your abdomen. °· Lying on your side with one pillow under your abdomen and another pillow between your legs. °· Sitting in a warm bath for 15-20 minutes or until the pain goes away. °· Take over-the-counter and prescription medicines only as told by your health care provider. °· Move slowly when you sit and stand. °· Avoid long walks if they cause pain. °· Stop or lessen your physical activities if they cause pain. °SEEK MEDICAL CARE IF: °· Your pain does not go away with treatment. °· You feel pain in your back that you did not have before. °· Your medicine is not helping. °SEEK IMMEDIATE MEDICAL CARE IF: °· You develop a fever or chills. °· You develop uterine contractions. °· You develop vaginal bleeding. °· You develop nausea or vomiting. °· You develop diarrhea. °· You have pain when you urinate. °  °This information is not intended to replace advice given to you by your health care provider. Make sure you discuss any questions you have with your health care provider. °  °Document Released: 07/28/2008 Document Revised: 01/11/2012 Document Reviewed: 12/26/2014 °Elsevier Interactive Patient Education ©2016 Elsevier Inc. ° °

## 2016-02-25 NOTE — MAU Provider Note (Signed)
History     CSN: [id number]  Arrival date and time: 02/25/16 2027   First Provider Initiated Contact with Patient 02/25/16 2053      Chief Complaint  Patient presents with  . Abdominal Pain  . Headache   HPI Ann Parsons is a 25 y.o. G3P1011 at [redacted]w[redacted]d who presents with abdominal pain. Symptoms began at noon today while at work. Reports RLQ pain that she describes as sharp & intermittent. Pain worse with position changes, movement, & walking. Reports some lower abdominal cramping that has since resolved. Currently rates pain 3/10. Also reports having headache since this afternoon that she rates as 1/10. Took tylenol earlier for pain.  Denies vaginal bleeding or LOF. Denies vision changes or epigastric pain.  Last intercourse last night.   OB History    Gravida Para Term Preterm AB TAB SAB Ectopic Multiple Living   Past Medical History  Diagnosis Date  . Chlamydia   . Scoliosis     mild  . History of GBS (group B streptococcus) UTI, currently pregnant     Past Surgical History  Procedure Laterality Date  . Nasal sinus surgery  08/2013    Family History  Problem Relation Age of Onset  . Kidney disease Mother   . Lupus Mother   . Stroke Father     Social History  Substance Use Topics  . Smoking status: Never Smoker   . Smokeless tobacco: Never Used  . Alcohol Use: Yes     Comment: occasionally    Allergies: No Known Allergies  Prescriptions prior to admission  Medication Sig Dispense Refill Last Dose  . Prenatal Vit-Fe Fumarate-FA (PRENATAL MULTIVITAMIN) TABS tablet Take 1 tablet by mouth daily at 12 noon.     . ondansetron (ZOFRAN ODT) 4 MG disintegrating tablet Take 1 tablet (4 mg total) by mouth every 8 (eight) hours as needed for nausea or vomiting. 20 tablet 0   . promethazine (PHENERGAN) 25 MG suppository Place 1 suppository (25 mg total) rectally every 6 (six) hours as needed for nausea or vomiting. 12 each 0     Review of Systems   Constitutional: Negative.   Eyes: Negative for blurred vision.  Respiratory: Negative.   Cardiovascular: Negative.   Gastrointestinal: Positive for abdominal pain. Negative for nausea, vomiting, diarrhea and constipation.  Genitourinary: Negative.   Musculoskeletal: Positive for back pain.  Neurological: Positive for headaches.   Physical Exam   Blood pressure 125/82, pulse 90, temperature 98 F (36.7 C), temperature source Oral, resp. rate 16, weight 185 lb (83.915 kg).  Physical Exam  Nursing note and vitals reviewed. Constitutional: She is oriented to person, place, and time. She appears well-developed and well-nourished. No distress.  HENT:  Head: Normocephalic and atraumatic.  Eyes: Conjunctivae are normal. Right eye exhibits no discharge. Left eye exhibits no discharge. No scleral icterus.  Neck: Normal range of motion.  Cardiovascular: Normal rate, regular rhythm and normal heart sounds.   No murmur heard. Respiratory: Effort normal and breath sounds normal. No respiratory distress. She has no wheezes.  GI: Soft. There is no tenderness. There is no rebound.  Neurological: She is alert and oriented to person, place, and time.  Skin: Skin is warm and dry. She is not diaphoretic.  Psychiatric: She has a normal mood and affect. Her behavior is normal. Judgment and thought content normal.   Dilation: 1 Effacement (%): Thick Cervical Position: Posterior  Station: -3 Presentation: Vertex Exam by:: Judeth HornErin Terika Pillard NP  Fetal Tracing:  Baseline: 135 Variability: moderate Accelerations: 15x15 Decelerations: none  Toco: irregular ctx. Palpate mild   MAU Course  Procedures Results for orders placed or performed during the hospital encounter of 02/25/16 (from the past 24 hour(s))  Urinalysis, Routine w reflex microscopic (not at [facility])     Status: None   Collection Time: 02/25/16  8:30 PM  Result Value Ref Range   Color, Urine YELLOW YELLOW   APPearance CLEAR CLEAR    Specific Gravity, Urine 1.010 1.005 - 1.030   pH 6.5 5.0 - 8.0   Glucose, UA NEGATIVE NEGATIVE mg/dL   Hgb urine dipstick NEGATIVE NEGATIVE   Bilirubin Urine NEGATIVE NEGATIVE   Ketones, ur NEGATIVE NEGATIVE mg/dL   Protein, ur NEGATIVE NEGATIVE mg/dL   Nitrite NEGATIVE NEGATIVE   Leukocytes, UA NEGATIVE NEGATIVE    MDM Reactive tracing Irregular contractions VSS S/w Dr. Mindi SlickerBanga; pt ok to discharge home. F/u with office tomorrow if symptoms continue Assessment and Plan  A: 1. Pain of round ligament affecting pregnancy, antepartum   2. Braxton Hick's contraction     P: Discharge home Recommend maternity support belt & slow position changes Pt has next ob appt tomorrow Preterm labor precautions  Judeth Hornrin Kaylinn Dedic 02/25/2016, 8:52 PM

## 2016-02-25 NOTE — MAU Note (Signed)
Right lower abd pain started at 11:30 pm.  Low back pain started then also. Headache since 4:30 pm.  No bleeding. No leaking. Baby moving well.

## 2016-03-02 ENCOUNTER — Inpatient Hospital Stay (HOSPITAL_COMMUNITY)
Admission: AD | Admit: 2016-03-02 | Discharge: 2016-03-02 | Disposition: A | Discharge status: Home / Self Care | Payer: Medicaid Other | Source: Ambulatory Visit | Attending: Obstetrics and Gynecology | Admitting: Obstetrics and Gynecology

## 2016-03-02 DIAGNOSIS — Z3A34 34 weeks gestation of pregnancy: Secondary | ICD-10-CM | POA: Insufficient documentation

## 2016-03-02 DIAGNOSIS — O3433 Maternal care for cervical incompetence, third trimester: Secondary | ICD-10-CM | POA: Diagnosis not present

## 2016-03-02 LAB — OB RESULTS CONSOLE GBS: GBS: POSITIVE

## 2016-03-02 MED ORDER — BETAMETHASONE SOD PHOS & ACET 6 (3-3) MG/ML IJ SUSP
12.0000 mg | Freq: Once | INTRAMUSCULAR | Status: AC
  Administered 2016-03-02: 12 mg via INTRAMUSCULAR
  Filled 2016-03-02: qty 2

## 2016-03-03 ENCOUNTER — Inpatient Hospital Stay (HOSPITAL_COMMUNITY)
Admission: AD | Admit: 2016-03-03 | Discharge: 2016-03-03 | Disposition: A | Discharge status: Home / Self Care | Payer: Medicaid Other | Source: Ambulatory Visit | Attending: Obstetrics and Gynecology | Admitting: Obstetrics and Gynecology

## 2016-03-03 DIAGNOSIS — O3433 Maternal care for cervical incompetence, third trimester: Secondary | ICD-10-CM | POA: Diagnosis not present

## 2016-03-03 DIAGNOSIS — Z3A34 34 weeks gestation of pregnancy: Secondary | ICD-10-CM | POA: Diagnosis not present

## 2016-03-03 MED ORDER — BETAMETHASONE SOD PHOS & ACET 6 (3-3) MG/ML IJ SUSP
12.0000 mg | Freq: Once | INTRAMUSCULAR | Status: AC
  Administered 2016-03-03: 12 mg via INTRAMUSCULAR
  Filled 2016-03-03: qty 2

## 2016-03-03 NOTE — MAU Note (Signed)
Her for 2nd betamethasone injection. No complaints

## 2016-03-26 ENCOUNTER — Telehealth (HOSPITAL_COMMUNITY): Payer: Self-pay | Admitting: *Deleted

## 2016-03-26 ENCOUNTER — Encounter (HOSPITAL_COMMUNITY): Payer: Self-pay | Admitting: *Deleted

## 2016-03-26 NOTE — Telephone Encounter (Signed)
Preadmission screen  

## 2016-04-03 ENCOUNTER — Inpatient Hospital Stay (HOSPITAL_COMMUNITY)
Admission: RE | Admit: 2016-04-03 | Discharge: 2016-04-05 | DRG: 775 | Disposition: A | Discharge status: Home / Self Care | Payer: Medicaid Other | Source: Ambulatory Visit | Attending: Obstetrics and Gynecology | Admitting: Obstetrics and Gynecology

## 2016-04-03 ENCOUNTER — Encounter (HOSPITAL_COMMUNITY): Payer: Self-pay

## 2016-04-03 ENCOUNTER — Inpatient Hospital Stay (HOSPITAL_COMMUNITY): Payer: Medicaid Other | Admitting: Anesthesiology

## 2016-04-03 DIAGNOSIS — Z3A39 39 weeks gestation of pregnancy: Secondary | ICD-10-CM | POA: Diagnosis not present

## 2016-04-03 DIAGNOSIS — O99824 Streptococcus B carrier state complicating childbirth: Principal | ICD-10-CM | POA: Diagnosis present

## 2016-04-03 DIAGNOSIS — Z823 Family history of stroke: Secondary | ICD-10-CM

## 2016-04-03 DIAGNOSIS — Z3480 Encounter for supervision of other normal pregnancy, unspecified trimester: Secondary | ICD-10-CM

## 2016-04-03 DIAGNOSIS — M419 Scoliosis, unspecified: Secondary | ICD-10-CM | POA: Diagnosis present

## 2016-04-03 LAB — TYPE AND SCREEN
ABO/RH(D): A NEG
Antibody Screen: NEGATIVE

## 2016-04-03 LAB — CBC
HEMATOCRIT: 28.7 % — AB (ref 36.0–46.0)
Hemoglobin: 9.2 g/dL — ABNORMAL LOW (ref 12.0–15.0)
MCH: 28.9 pg (ref 26.0–34.0)
MCHC: 32.1 g/dL (ref 30.0–36.0)
MCV: 90.3 fL (ref 78.0–100.0)
PLATELETS: 197 10*3/uL (ref 150–400)
RBC: 3.18 MIL/uL — ABNORMAL LOW (ref 3.87–5.11)
RDW: 13.3 % (ref 11.5–15.5)
WBC: 8.4 10*3/uL (ref 4.0–10.5)

## 2016-04-03 LAB — RPR: RPR: NONREACTIVE

## 2016-04-03 MED ORDER — OXYTOCIN 40 UNITS IN LACTATED RINGERS INFUSION - SIMPLE MED
1.0000 m[IU]/min | INTRAVENOUS | Status: DC
  Administered 2016-04-03: 2 m[IU]/min via INTRAVENOUS
  Filled 2016-04-03: qty 1000

## 2016-04-03 MED ORDER — LACTATED RINGERS IV SOLN
500.0000 mL | Freq: Once | INTRAVENOUS | Status: AC
  Administered 2016-04-03: 500 mL via INTRAVENOUS

## 2016-04-03 MED ORDER — OXYTOCIN 40 UNITS IN LACTATED RINGERS INFUSION - SIMPLE MED: 2.5000 [IU]/h | INTRAVENOUS | Status: DC

## 2016-04-03 MED ORDER — VITAMIN K1 1 MG/0.5ML IJ SOLN
INTRAMUSCULAR | Status: AC
  Filled 2016-04-03: qty 0.5

## 2016-04-03 MED ORDER — LACTATED RINGERS IV SOLN
INTRAVENOUS | Status: DC
  Administered 2016-04-03 (×2): via INTRAVENOUS

## 2016-04-03 MED ORDER — TETANUS-DIPHTH-ACELL PERTUSSIS 5-2.5-18.5 LF-MCG/0.5 IM SUSP: 0.5000 mL | Freq: Once | INTRAMUSCULAR | Status: DC

## 2016-04-03 MED ORDER — PENICILLIN G POTASSIUM 5000000 UNITS IJ SOLR
5.0000 10*6.[IU] | Freq: Once | INTRAVENOUS | Status: AC
  Administered 2016-04-03: 5 10*6.[IU] via INTRAVENOUS
  Filled 2016-04-03: qty 5

## 2016-04-03 MED ORDER — PENICILLIN G POTASSIUM 5000000 UNITS IJ SOLR
2.5000 10*6.[IU] | INTRAVENOUS | Status: DC
  Administered 2016-04-03: 2.5 10*6.[IU] via INTRAVENOUS
  Filled 2016-04-03 (×7): qty 2.5

## 2016-04-03 MED ORDER — SIMETHICONE 80 MG PO CHEW: 80.0000 mg | CHEWABLE_TABLET | ORAL | Status: DC | PRN

## 2016-04-03 MED ORDER — OXYCODONE HCL 5 MG PO TABS: 10.0000 mg | ORAL_TABLET | ORAL | Status: DC | PRN

## 2016-04-03 MED ORDER — SOD CITRATE-CITRIC ACID 500-334 MG/5ML PO SOLN
30.0000 mL | ORAL | Status: DC | PRN
Start: 2016-04-03 — End: 2016-04-03

## 2016-04-03 MED ORDER — METHYLERGONOVINE MALEATE 0.2 MG/ML IJ SOLN: 0.2000 mg | INTRAMUSCULAR | Status: DC | PRN

## 2016-04-03 MED ORDER — COCONUT OIL OIL
1.0000 "application " | TOPICAL_OIL | Status: DC | PRN
  Administered 2016-04-04: 1 via TOPICAL
  Filled 2016-04-03: qty 120

## 2016-04-03 MED ORDER — MEASLES, MUMPS & RUBELLA VAC ~~LOC~~ INJ
0.5000 mL | INJECTION | Freq: Once | SUBCUTANEOUS | Status: DC
  Filled 2016-04-03: qty 0.5

## 2016-04-03 MED ORDER — BENZOCAINE-MENTHOL 20-0.5 % EX AERO
1.0000 | INHALATION_SPRAY | CUTANEOUS | Status: DC | PRN
Start: 2016-04-03 — End: 2016-04-05

## 2016-04-03 MED ORDER — LIDOCAINE HCL (PF) 1 % IJ SOLN
30.0000 mL | INTRAMUSCULAR | Status: DC | PRN
  Filled 2016-04-03: qty 30

## 2016-04-03 MED ORDER — DIPHENHYDRAMINE HCL 25 MG PO CAPS: 25.0000 mg | ORAL_CAPSULE | Freq: Four times a day (QID) | ORAL | Status: DC | PRN

## 2016-04-03 MED ORDER — PHENYLEPHRINE 40 MCG/ML (10ML) SYRINGE FOR IV PUSH (FOR BLOOD PRESSURE SUPPORT)
80.0000 ug | PREFILLED_SYRINGE | INTRAVENOUS | Status: DC | PRN
  Filled 2016-04-03: qty 5

## 2016-04-03 MED ORDER — FENTANYL 2.5 MCG/ML BUPIVACAINE 1/10 % EPIDURAL INFUSION (WH - ANES)
14.0000 mL/h | INTRAMUSCULAR | Status: DC | PRN
Start: 2016-04-03 — End: 2016-04-03
  Administered 2016-04-03: 14 mL/h via EPIDURAL
  Filled 2016-04-03: qty 125

## 2016-04-03 MED ORDER — ONDANSETRON HCL 4 MG/2ML IJ SOLN: 4.0000 mg | INTRAMUSCULAR | Status: DC | PRN

## 2016-04-03 MED ORDER — ZOLPIDEM TARTRATE 5 MG PO TABS: 5.0000 mg | ORAL_TABLET | Freq: Every evening | ORAL | Status: DC | PRN

## 2016-04-03 MED ORDER — METHYLERGONOVINE MALEATE 0.2 MG PO TABS: 0.2000 mg | ORAL_TABLET | ORAL | Status: DC | PRN

## 2016-04-03 MED ORDER — WITCH HAZEL-GLYCERIN EX PADS: 1.0000 "application " | MEDICATED_PAD | CUTANEOUS | Status: DC | PRN

## 2016-04-03 MED ORDER — OXYCODONE-ACETAMINOPHEN 5-325 MG PO TABS: 2.0000 | ORAL_TABLET | ORAL | Status: DC | PRN

## 2016-04-03 MED ORDER — EPHEDRINE 5 MG/ML INJ
10.0000 mg | INTRAVENOUS | Status: DC | PRN
  Filled 2016-04-03: qty 2

## 2016-04-03 MED ORDER — LIDOCAINE HCL (PF) 1 % IJ SOLN
INTRAMUSCULAR | Status: DC | PRN
  Administered 2016-04-03 (×2): 5 mL

## 2016-04-03 MED ORDER — DIPHENHYDRAMINE HCL 50 MG/ML IJ SOLN: 12.5000 mg | INTRAMUSCULAR | Status: DC | PRN

## 2016-04-03 MED ORDER — PHENYLEPHRINE 40 MCG/ML (10ML) SYRINGE FOR IV PUSH (FOR BLOOD PRESSURE SUPPORT)
80.0000 ug | PREFILLED_SYRINGE | INTRAVENOUS | Status: DC | PRN
  Filled 2016-04-03: qty 5
  Filled 2016-04-03: qty 10

## 2016-04-03 MED ORDER — ONDANSETRON HCL 4 MG/2ML IJ SOLN: 4.0000 mg | Freq: Four times a day (QID) | INTRAMUSCULAR | Status: DC | PRN

## 2016-04-03 MED ORDER — LACTATED RINGERS IV SOLN
500.0000 mL | INTRAVENOUS | Status: DC | PRN
Start: 2016-04-03 — End: 2016-04-03

## 2016-04-03 MED ORDER — LORATADINE 10 MG PO TABS: 10.0000 mg | ORAL_TABLET | Freq: Every day | ORAL | Status: DC | PRN

## 2016-04-03 MED ORDER — OXYTOCIN BOLUS FROM INFUSION: 500.0000 mL | INTRAVENOUS | Status: DC

## 2016-04-03 MED ORDER — OXYCODONE HCL 5 MG PO TABS: 5.0000 mg | ORAL_TABLET | ORAL | Status: DC | PRN

## 2016-04-03 MED ORDER — ACETAMINOPHEN 325 MG PO TABS: 650.0000 mg | ORAL_TABLET | ORAL | Status: DC | PRN

## 2016-04-03 MED ORDER — DIBUCAINE 1 % RE OINT: 1.0000 "application " | TOPICAL_OINTMENT | RECTAL | Status: DC | PRN

## 2016-04-03 MED ORDER — OXYCODONE-ACETAMINOPHEN 5-325 MG PO TABS: 1.0000 | ORAL_TABLET | ORAL | Status: DC | PRN

## 2016-04-03 MED ORDER — MAGNESIUM HYDROXIDE 400 MG/5ML PO SUSP: 30.0000 mL | ORAL | Status: DC | PRN

## 2016-04-03 MED ORDER — TERBUTALINE SULFATE 1 MG/ML IJ SOLN
0.2500 mg | Freq: Once | INTRAMUSCULAR | Status: DC | PRN
  Filled 2016-04-03: qty 1

## 2016-04-03 MED ORDER — IBUPROFEN 600 MG PO TABS
600.0000 mg | ORAL_TABLET | Freq: Four times a day (QID) | ORAL | Status: DC
  Administered 2016-04-03 – 2016-04-05 (×7): 600 mg via ORAL
  Filled 2016-04-03 (×7): qty 1

## 2016-04-03 MED ORDER — SENNOSIDES-DOCUSATE SODIUM 8.6-50 MG PO TABS
2.0000 | ORAL_TABLET | ORAL | Status: DC
  Administered 2016-04-04 – 2016-04-05 (×2): 2 via ORAL
  Filled 2016-04-03 (×2): qty 2

## 2016-04-03 MED ORDER — PRENATAL MULTIVITAMIN CH
1.0000 | ORAL_TABLET | Freq: Every day | ORAL | Status: DC
  Administered 2016-04-04: 1 via ORAL
  Filled 2016-04-03: qty 1

## 2016-04-03 MED ORDER — BUTORPHANOL TARTRATE 1 MG/ML IJ SOLN: 1.0000 mg | INTRAMUSCULAR | Status: DC | PRN

## 2016-04-03 MED ORDER — ONDANSETRON HCL 4 MG PO TABS: 4.0000 mg | ORAL_TABLET | ORAL | Status: DC | PRN

## 2016-04-03 NOTE — Anesthesia Procedure Notes (Signed)
Epidural Patient location during procedure: OB  Staffing Anesthesiologist: Phillips GroutARIGNAN, Johncharles Fusselman Performed by: anesthesiologist   Preanesthetic Checklist Completed: patient identified, site marked, surgical consent, pre-op evaluation, timeout performed, IV checked, risks and benefits discussed and monitors and equipment checked  Epidural Patient position: sitting Prep: DuraPrep Patient monitoring: heart rate, continuous pulse ox and blood pressure Injection technique: LOR saline  Needle:  Needle type: Tuohy  Needle gauge: 17 G Needle length: 9 cm and 9 Needle insertion depth: 7 cm Catheter type: closed end flexible Catheter size: 20 Guage Catheter at skin depth: 12 cm Test dose: negative  Assessment Events: blood not aspirated, injection not painful, no injection resistance, negative IV test and no paresthesia  Additional Notes Patient identified. Risks/Benefits/Options discussed with patient including but not limited to bleeding, infection, nerve damage, paralysis, failed block, incomplete pain control, headache, blood pressure changes, nausea, vomiting, reactions to medication both or allergic, itching and postpartum back pain. Confirmed with bedside nurse the patient's most recent platelet count. Confirmed with patient that they are not currently taking any anticoagulation, have any bleeding history or any family history of bleeding disorders. Patient expressed understanding and wished to proceed. All questions were answered. Sterile technique was used throughout the entire procedure. Please see nursing notes for vital signs. Test dose was given through epidural needle and negative prior to continuing to dose epidural or start infusion. Warning signs of high block given to the patient including shortness of breath, tingling/numbness in hands, complete motor block, or any concerning symptoms with instructions to call for help. Patient was given instructions on fall risk and not to get out  of bed. All questions and concerns addressed with instructions to call with any issues.

## 2016-04-03 NOTE — Progress Notes (Signed)
Comfortable with epidural Afeb, VSS FHT-110-120, Cat I VE-8/90/0, vtx, AROM no fluid Continue pitocin and PCN, anticipate SVD

## 2016-04-03 NOTE — Anesthesia Pain Management Evaluation Note (Signed)
  CRNA Pain Management Visit Note  Patient: Ann Parsons, 25 y.o., female  "Hello I am a member of the anesthesia team at [facility]. We have an anesthesia team available at all times to provide care throughout the hospital, including epidural management and anesthesia for C-section. I don't know your plan for the delivery whether it a natural birth, water birth, IV sedation, nitrous supplementation, doula or epidural, but we want to meet your pain goals."   1.Was your pain managed to your expectations on prior hospitalizations?   Yes   2.What is your expectation for pain management during this hospitalization?     Epidural  3.How can we help you reach that goal? Possible epidural but patient does not want epidural now.  Record the patient's initial score and the patient's pain goal.   Pain: 5  Pain Goal: 8 [facility] wants you to be able to say your pain was always managed very well.  Ann Parsons 04/03/2016

## 2016-04-03 NOTE — Lactation Note (Signed)
This note was copied from a baby's chart. Lactation Consultation Note Initial visit at 9 hours of age.  Mom reports a few good feedings and denies pain.  Feeding already in progress as LC entered room.  Baby has shallow latch and LC assisted with breaking latch and working on getting a deeper latch.  Assisted with cross cradle hold and several attempts before baby would tolerate a deep latch.  LC assisted with pillows for comfort and baby continues to suck with stimulation. [facility] resources given and discussed.  Encouraged to feed with early cues on demand.  Early newborn behavior discussed.  Hand expression demonstrated with colostrum visible.  Mom to call for assist as needed.    Patient Name: Girl Ashley RoyaltyCabria Miles Today's Date: 04/03/2016 Reason for consult: Initial assessment   Maternal Data Has patient been taught Hand Expression?: Yes Does the patient have breastfeeding experience prior to this delivery?: Yes  Feeding Feeding Type: Breast Fed Length of feed:  (observed about 15 minutes)  LATCH Score/Interventions Latch: Grasps breast easily, tongue down, lips flanged, rhythmical sucking.  Audible Swallowing: A few with stimulation  Type of Nipple: Everted at rest and after stimulation  Comfort (Breast/Nipple): Soft / non-tender     Hold (Positioning): Assistance needed to correctly position infant at breast and maintain latch. Intervention(s): Support Pillows;Skin to skin;Position options  LATCH Score: 8  Lactation Tools Discussed/Used WIC Program: No Pump Review: Setup, frequency, and cleaning Initiated by:: JS Date initiated:: 04/03/16   Consult Status Consult Status: Follow-up Date: 04/04/16 Follow-up type: In-patient    Shoptaw, Arvella MerlesJana Lynn 04/03/2016, 10:41 PM

## 2016-04-03 NOTE — Anesthesia Preprocedure Evaluation (Signed)
Anesthesia Evaluation  Patient identified by MRN, date of birth, ID band Patient awake    Reviewed: Allergy & Precautions, H&P , Patient's Chart, lab work & pertinent test results  Airway Mallampati: III  TM Distance: >3 FB Neck ROM: full    Dental no notable dental hx. (+) Teeth Intact   Pulmonary neg pulmonary ROS,    Pulmonary exam normal breath sounds clear to auscultation       Cardiovascular negative cardio ROS   Rhythm:regular Rate:Normal     Neuro/Psych negative neurological ROS  negative psych ROS   GI/Hepatic negative GI ROS, Neg liver ROS,   Endo/Other  negative endocrine ROS  Renal/GU negative Renal ROS  negative genitourinary   Musculoskeletal   Abdominal   Peds  Hematology negative hematology ROS (+)   Anesthesia Other Findings Scloiosis  Reproductive/Obstetrics (+) Pregnancy                             Anesthesia Physical  Anesthesia Plan  ASA: II  Anesthesia Plan: Epidural   Post-op Pain Management:    Induction:   Airway Management Planned:   Additional Equipment:   Intra-op Plan:   Post-operative Plan:   Informed Consent: I have reviewed the patients History and Physical, chart, labs and discussed the procedure including the risks, benefits and alternatives for the proposed anesthesia with the patient or authorized representative who has indicated his/her understanding and acceptance.     Plan Discussed with: Anesthesiologist  Anesthesia Plan Comments:         Anesthesia Quick Evaluation

## 2016-04-03 NOTE — H&P (Signed)
Ann Parsons is a 25 y.o. female, G3 P1011, EGA 39+ weeks with EDC 6-7 presenting for elective induction with favorable cervix.  Prenatal care uncomplicated.  Maternal Medical History:  Fetal activity: Perceived fetal activity is normal.    Prenatal complications: no prenatal complications   OB History    Gravida Para Term Preterm AB TAB SAB Ectopic Multiple Living   3 1 1  1 1    1     SVD, no comps  Past Medical History  Diagnosis Date  . Chlamydia   . Scoliosis     mild  . History of GBS (group B streptococcus) UTI, currently pregnant    Past Surgical History  Procedure Laterality Date  . Nasal sinus surgery  08/2013  . Dilation and curettage of uterus     Family History: family history includes Kidney disease in her mother; Lupus in her mother; Stroke in her father. Social History:  reports that she has never smoked. She has never used smokeless tobacco. She reports that she drinks alcohol. She reports that she does not use illicit drugs.   Prenatal Transfer Tool  Maternal Diabetes: No Genetic Screening: Normal Maternal Ultrasounds/Referrals: Normal Fetal Ultrasounds or other Referrals:  None Maternal Substance Abuse:  No Significant Maternal Medications:  None Significant Maternal Lab Results:  Lab values include: Group B Strep positive Other Comments:  None  Review of Systems  Respiratory: Negative.   Cardiovascular: Negative.  Negative for PND.      Blood pressure 121/87, pulse 83, temperature 98.1 F (36.7 C), temperature source Oral, resp. rate 17. Maternal Exam:  Abdomen: Patient reports no abdominal tenderness. Estimated fetal weight is  8 lbs.   Fetal presentation: vertex  Introitus: Normal vulva. Normal vagina.  Amniotic fluid character: not assessed.  Pelvis: adequate for delivery.   Cervix: Cervix evaluated by digital exam.     Fetal Exam Fetal Monitor Review: Mode: ultrasound.   Variability: moderate (6-25 bpm).   Pattern: accelerations  present and no decelerations.    Fetal State Assessment: Category I - tracings are normal.     Physical Exam  Vitals reviewed. Constitutional: She appears well-developed and well-nourished.  Cardiovascular: Normal rate, regular rhythm and normal heart sounds.   No murmur heard. Respiratory: Effort normal and breath sounds normal. No respiratory distress. She has no wheezes.  GI: Soft.    Prenatal labs: ABO, Rh: A/Negative/-- (11/08 0000) Antibody: Negative (11/08 0000) Rubella: Immune (11/08 0000) RPR: Nonreactive (11/08 0000)  HBsAg: Negative (11/08 0000)  HIV: Non-reactive (11/08 0000)  GBS: Positive (05/01 0000)   Assessment/Plan: IUP at 39+ weeks with favorable cervix for induction, +GBS.  Will start pitocin and monitor progress, PCN for +GBS.   Elbridge Magowan D 04/03/2016, 8:11 AM

## 2016-04-04 MED ORDER — RHO D IMMUNE GLOBULIN 1500 UNIT/2ML IJ SOSY
300.0000 ug | PREFILLED_SYRINGE | Freq: Once | INTRAMUSCULAR | Status: AC
  Administered 2016-04-04: 300 ug via INTRAVENOUS
  Filled 2016-04-04: qty 2

## 2016-04-04 NOTE — Anesthesia Postprocedure Evaluation (Signed)
Anesthesia Post Note  Patient: Edilia BoCabria C Miles  Procedure(s) Performed: * No procedures listed *  Patient location during evaluation: Mother Baby Anesthesia Type: Epidural Level of consciousness: awake Pain management: pain level controlled Vital Signs Assessment: post-procedure vital signs reviewed and stable Respiratory status: spontaneous breathing Cardiovascular status: stable Postop Assessment: no headache, no backache, epidural receding, patient able to bend at knees, no signs of nausea or vomiting and adequate PO intake Anesthetic complications: no     Last Vitals:  Filed Vitals:   04/03/16 2030 04/04/16 0500  BP: 134/78 113/76  Pulse: 79 78  Temp: 36.9 C 36.7 C  Resp: 18 18    Last Pain:  Filed Vitals:   04/04/16 0656  PainSc: 0-No pain   Pain Goal: Patients Stated Pain Goal: 2 (04/04/16 0034)               Fanny DanceMULLINS,Lucius Wise

## 2016-04-04 NOTE — Lactation Note (Signed)
This note was copied from a baby's chart. Lactation Consultation Note  Patient Name: Ann Ashley RoyaltyCabria Miles NWGNF'AToday's Date: 04/04/2016 Reason for consult: Follow-up assessment Baby at 27 hr of life. Mom reports bf is going well. She is reporting bilateral nipple soreness, no redness or skin break down seen. She voiced no concerns. Discussed baby behavior, feeding frequency, voids, wt loss, breast changes, and nipple care. She is aware of OP services and support group. Report given to RN so coconut oil can be offered if needed.     Maternal Data    Feeding Feeding Type: Breast Fed Length of feed: 15 min  LATCH Score/Interventions Latch: Grasps breast easily, tongue down, lips flanged, rhythmical sucking.  Audible Swallowing: A few with stimulation  Type of Nipple: Everted at rest and after stimulation  Comfort (Breast/Nipple): Soft / non-tender     Hold (Positioning): No assistance needed to correctly position infant at breast.  LATCH Score: 9  Lactation Tools Discussed/Used     Consult Status Consult Status: Follow-up Date: 04/05/16 Follow-up type: In-patient    Rulon Eisenmengerlizabeth E Aristides Luckey 04/04/2016, 4:46 PM

## 2016-04-04 NOTE — Progress Notes (Signed)
PPD #1 No problems Afeb, VSS Fundus firm, NT at U-1 Continue routine postpartum care 

## 2016-04-05 LAB — RH IG WORKUP (INCLUDES ABO/RH)
ABO/RH(D): A NEG
FETAL SCREEN: NEGATIVE
GESTATIONAL AGE(WKS): 39.2
Unit division: 0

## 2016-04-05 MED ORDER — IBUPROFEN 600 MG PO TABS: 600.0000 mg | ORAL_TABLET | Freq: Four times a day (QID) | ORAL | Status: DC

## 2016-04-05 NOTE — Progress Notes (Signed)
PPD #2 Doing well Afeb, VSS Fundus firm D/c home 

## 2016-04-05 NOTE — Discharge Summary (Signed)
OB Discharge Summary     Patient Name: Ann Parsons DOB: [birth year] MRN: [id number]  Date of admission: 04/03/2016 Delivering MD: Jackelyn Knife, Gelene Recktenwald   Date of discharge: 04/05/2016  Admitting diagnosis: INDUCTION Intrauterine pregnancy: [redacted]w[redacted]d     Secondary diagnosis:  Active Problems:   Normal pregnancy, repeat      Discharge diagnosis: Term Pregnancy Delivered                                                                                                 Augmentation: AROM and Pitocin  Complications: None  Hospital course:  Induction of Labor With Vaginal Delivery   25 y.o. yo [id number] at [redacted]w[redacted]d was admitted to the hospital 04/03/2016 for induction of labor.  Indication for induction: Favorable cervix at term.  Patient had an uncomplicated labor course as follows: Membrane Rupture Time/Date: 12:23 PM ,04/03/2016   Intrapartum Procedures: Episiotomy: None [1]                                         Lacerations:  None [1]  Patient had delivery of a Viable infant.  Information for the patient's newborn:  Raima, Geathers [[id number]]  Delivery Method: Vag-Spont   04/03/2016  Details of delivery can be found in separate delivery note.  Patient had a routine postpartum course. Patient is discharged home 04/05/2016.   Physical exam  Filed Vitals:   04/03/16 2030 04/04/16 0500 04/04/16 1856 04/05/16 0505  BP: 134/78 113/76 111/65 129/85  Pulse: 79 78 85 77  Temp: 98.4 F (36.9 C) 98 F (36.7 C) 98.4 F (36.9 C) 97.8 F (36.6 C)  TempSrc: Oral Oral Oral Oral  Resp: Height:      Weight:      SpO2:  100% 100%    General: alert Lochia: appropriate Uterine Fundus: firm  Labs: Lab Results  Component Value Date   WBC 8.4 04/03/2016   HGB 9.2* 04/03/2016   HCT 28.7* 04/03/2016   MCV 90.3 04/03/2016   PLT 197 04/03/2016   CMP Latest Ref Rng 08/31/2011  Glucose 70 - 99 mg/dL 89  BUN 6 - 23 mg/dL 6  Creatinine 2.84 - 1.32 mg/dL 4.40  Sodium 102 - 725 mEq/L  133(L)  Potassium 3.5 - 5.1 mEq/L 3.4(L)  Chloride 96 - 112 mEq/L 101  CO2 19 - 32 mEq/L 25  Calcium 8.4 - 10.5 mg/dL 9.0  Total Protein 6.0 - 8.3 g/dL 3.6(U)  Total Bilirubin 0.3 - 1.2 mg/dL 0.5  Alkaline Phos 39 - 117 U/L 139(H)  AST 0 - 37 U/L 30  ALT 0 - 35 U/L 16    Discharge instruction: per After Visit Summary and "Baby and Me Booklet".  After visit meds:    Medication List    TAKE these medications        acetaminophen 500 MG tablet  Commonly known as:  TYLENOL  Take 1,000 mg by mouth every 6 (six) hours as needed for mild pain  or headache.     ibuprofen 600 MG tablet  Commonly known as:  ADVIL,MOTRIN  Take 1 tablet (600 mg total) by mouth every 6 (six) hours.     loratadine 10 MG tablet  Commonly known as:  CLARITIN  Take 10 mg by mouth daily as needed for allergies.     prenatal multivitamin Tabs tablet  Take 1 tablet by mouth daily at 12 noon.        Diet: routine diet  Activity: Advance as tolerated. Pelvic rest for 6 weeks.   Outpatient follow up:6 weeks  Newborn Data: Live born female  Birth Weight: 8 lb 0.2 oz (3635 g) APGAR: 9, 10  Baby Feeding: Breast Disposition:home with mother   04/05/2016 Zenaida NieceMEISINGER,Vianney Kopecky D, MD

## 2016-04-05 NOTE — Discharge Instructions (Signed)
As per discharge pamphlet °

## 2016-04-05 NOTE — Lactation Note (Signed)
This note was copied from a baby's chart. Lactation Consultation Note: Experienced BF mom has baby latched to breast when I went into room. Reports baby has been nursing well with no pain. Reports breasts are feeling fuller this morning. Reviewed our phone number to call with questions/concerns. No questions at present. To call prn  Patient Name: Ann Ashley RoyaltyCabria Miles [id number]'s Date: 04/05/2016 Reason for consult: Follow-up assessment   Maternal Data Formula Feeding for Exclusion: No Has patient been taught Hand Expression?: Yes Does the patient have breastfeeding experience prior to this delivery?: Yes  Feeding Feeding Type: Breast Fed Length of feed: 15 min  LATCH Score/Interventions Latch: Grasps breast easily, tongue down, lips flanged, rhythmical sucking.  Audible Swallowing: A few with stimulation  Type of Nipple: Everted at rest and after stimulation  Comfort (Breast/Nipple): Soft / non-tender     Hold (Positioning): No assistance needed to correctly position infant at breast. Intervention(s): Breastfeeding basics reviewed  LATCH Score: 9  Lactation Tools Discussed/Used     Consult Status Consult Status: Complete    Pamelia HoitWeeks, Taimi Towe D 04/05/2016, 8:11 AM

## 2018-03-23 ENCOUNTER — Emergency Department (HOSPITAL_COMMUNITY)
Admission: EM | Admit: 2018-03-23 | Discharge: 2018-03-23 | Disposition: A | Discharge status: Home / Self Care | Payer: Self-pay | Attending: Emergency Medicine | Admitting: Emergency Medicine

## 2018-03-23 ENCOUNTER — Other Ambulatory Visit: Payer: Self-pay

## 2018-03-23 ENCOUNTER — Encounter (HOSPITAL_COMMUNITY): Payer: Self-pay

## 2018-03-23 DIAGNOSIS — R509 Fever, unspecified: Secondary | ICD-10-CM | POA: Insufficient documentation

## 2018-03-23 DIAGNOSIS — Z79899 Other long term (current) drug therapy: Secondary | ICD-10-CM | POA: Insufficient documentation

## 2018-03-23 DIAGNOSIS — E876 Hypokalemia: Secondary | ICD-10-CM | POA: Insufficient documentation

## 2018-03-23 LAB — I-STAT CHEM 8, ED
BUN: 3 mg/dL — AB (ref 6–20)
CALCIUM ION: 1.12 mmol/L — AB (ref 1.15–1.40)
CHLORIDE: 98 mmol/L — AB (ref 101–111)
Creatinine, Ser: 0.9 mg/dL (ref 0.44–1.00)
GLUCOSE: 103 mg/dL — AB (ref 65–99)
HCT: 40 % (ref 36.0–46.0)
Hemoglobin: 13.6 g/dL (ref 12.0–15.0)
Potassium: 3.2 mmol/L — ABNORMAL LOW (ref 3.5–5.1)
Sodium: 135 mmol/L (ref 135–145)
TCO2: 25 mmol/L (ref 22–32)

## 2018-03-23 LAB — URINALYSIS, ROUTINE W REFLEX MICROSCOPIC
BILIRUBIN URINE: NEGATIVE
Glucose, UA: NEGATIVE mg/dL
HGB URINE DIPSTICK: NEGATIVE
Ketones, ur: NEGATIVE mg/dL
Leukocytes, UA: NEGATIVE
Nitrite: NEGATIVE
PH: 6 (ref 5.0–8.0)
Protein, ur: NEGATIVE mg/dL
SPECIFIC GRAVITY, URINE: 1.016 (ref 1.005–1.030)

## 2018-03-23 LAB — POC URINE PREG, ED: PREG TEST UR: NEGATIVE

## 2018-03-23 LAB — GROUP A STREP BY PCR: Group A Strep by PCR: NOT DETECTED

## 2018-03-23 MED ORDER — IBUPROFEN 400 MG PO TABS
600.0000 mg | ORAL_TABLET | Freq: Once | ORAL | Status: AC
  Administered 2018-03-23: 600 mg via ORAL
  Filled 2018-03-23: qty 1

## 2018-03-23 MED ORDER — POTASSIUM CHLORIDE CRYS ER 20 MEQ PO TBCR
40.0000 meq | EXTENDED_RELEASE_TABLET | Freq: Once | ORAL | Status: AC
  Administered 2018-03-23: 40 meq via ORAL
  Filled 2018-03-23: qty 2

## 2018-03-23 NOTE — ED Triage Notes (Signed)
Pt endorses generalized malaise with bodyaches, chills and headache since yesterday. Pt's son had same thing a couple days ago. VSS. Afebrile.

## 2018-03-23 NOTE — Discharge Instructions (Signed)
Please rest and drink plenty of fluids Alternate Tylenol and Ibuprofen for fever and aches Return in 2-3 days if you are not improving

## 2018-03-23 NOTE — ED Provider Notes (Addendum)
[facility] EMERGENCY DEPARTMENT Provider Note   CSN: [id number] Arrival date & time: 03/23/18  1005     History   Chief Complaint Chief Complaint  Patient presents with  . Chills    HPI Ann Parsons is a 27 y.o. female who presents with fever and chills.  She states that her symptoms had an acute onset starting yesterday.  She reports intermittent fever, chills, headache, neck stiffness, diffuse body aches.  She denies ear pain, runny nose or congestion, sore throat, cough, abdominal pain, nausea, vomiting, diarrhea, urinary symptoms, vaginal discharge.  Her son had similar symptoms and was seen in the emergency department and diagnosed with a nonspecific viral illness.  He did improve without any specific intervention in a couple days.  She has been doing Tylenol for her fever but reports that her symptoms do not improve when her fever is down. No recent travel, tick bites, rash. She has had a flu shot this year.  HPI  Past Medical History:  Diagnosis Date  . Chlamydia   . History of GBS (group B streptococcus) UTI, currently pregnant   . Scoliosis    mild    Patient Active Problem List   Diagnosis Date Noted  . Normal pregnancy, repeat 04/03/2016  . Breakthrough bleeding with IUD 10/05/2014    Past Surgical History:  Procedure Laterality Date  . DILATION AND CURETTAGE OF UTERUS    . NASAL SINUS SURGERY  08/2013     OB History    Gravida  3   Para  2   Term  2   Preterm      AB  1   Living  2     SAB      TAB  1   Ectopic      Multiple  0   Live Births  2            Home Medications    Prior to Admission medications   Medication Sig Start Date End Date Taking? Authorizing Provider  acetaminophen (TYLENOL) 500 MG tablet Take 1,000 mg by mouth every 6 (six) hours as needed for mild pain or headache.    [provider]  ibuprofen (ADVIL,MOTRIN) 600 MG tablet Take 1 tablet (600 mg total) by mouth every 6 (six)  hours. 04/05/16   Meisinger, Todd, MD  loratadine (CLARITIN) 10 MG tablet Take 10 mg by mouth daily as needed for allergies.    [provider]  Prenatal Vit-Fe Fumarate-FA (PRENATAL MULTIVITAMIN) TABS tablet Take 1 tablet by mouth daily at 12 noon.     [provider]    Family History Family History  Problem Relation Age of Onset  . Kidney disease Mother   . Lupus Mother   . Stroke Father     Social History Social History   Tobacco Use  . Smoking status: Never Smoker  . Smokeless tobacco: Never Used  Substance Use Topics  . Alcohol use: Yes    Comment: occasionally  . Drug use: No     Allergies   Patient has no known allergies.   Review of Systems Review of Systems  Constitutional: Positive for chills and fever.  HENT: Negative for congestion, rhinorrhea and sore throat.   Respiratory: Negative for cough.   Gastrointestinal: Negative for abdominal pain, diarrhea, nausea and vomiting.  Genitourinary: Negative for dysuria and frequency.  Musculoskeletal: Positive for back pain and neck stiffness.  Skin: Negative for rash.  Neurological: Positive for headaches.  Physical Exam Updated Vital Signs BP 129/80 (BP Location: Right Arm)   Pulse 94   Temp (!) 101.5 F (38.6 C) (Oral)   Resp 16   Ht  (1.626 m)   Wt 65.8 kg (145 lb)   LMP 03/23/2018 (Exact Date)   SpO2 100%   Breastfeeding? No   BMI 24.89 kg/m   Physical Exam  Constitutional: She is oriented to person, place, and time. She appears well-developed and well-nourished. No distress.  Calm and cooperative.  Mildly ill-appearing  HENT:  Head: Normocephalic and atraumatic.  Right Ear: Tympanic membrane normal.  Left Ear: Tympanic membrane normal.  Nose: Nose normal.  Mouth/Throat: Uvula is midline, oropharynx is clear and moist and mucous membranes are normal.  Eyes: Pupils are equal, round, and reactive to light. Conjunctivae are normal. Right eye exhibits no discharge. Left  eye exhibits no discharge. No scleral icterus.  Neck: Normal range of motion.  Normal range of motion of neck.  Cardiovascular: Normal rate and regular rhythm.  Pulmonary/Chest: Effort normal and breath sounds normal. No respiratory distress.  Abdominal: Soft. Bowel sounds are normal. She exhibits no distension. There is no tenderness.  Musculoskeletal:  Diffuse low back tenderness.  No CVA tenderness  Neurological: She is alert and oriented to person, place, and time.  Skin: Skin is warm and dry.  Psychiatric: She has a normal mood and affect. Her behavior is normal.  Nursing note and vitals reviewed.    ED Treatments / Results  Labs (all labs ordered are listed, but only abnormal results are displayed) Labs Reviewed  I-STAT CHEM 8, ED - Abnormal; Notable for the following components:      Result Value   Potassium 3.2 (*)    Chloride 98 (*)    BUN 3 (*)    Glucose, Bld 103 (*)    Calcium, Ion 1.12 (*)    All other components within normal limits  GROUP A STREP BY PCR  URINALYSIS, ROUTINE W REFLEX MICROSCOPIC  POC URINE PREG, ED    EKG None  Radiology No results found.  Procedures Procedures (including critical care time)  Medications Ordered in ED Medications  ibuprofen (ADVIL,MOTRIN) tablet 600 mg (600 mg Oral Given 03/23/18 1225)  potassium chloride SA (K-DUR,KLOR-CON) CR tablet 40 mEq (40 mEq Oral Given 03/23/18 1356)     Initial Impression / Assessment and Plan / ED Course  I have reviewed the triage vital signs and the nursing notes.  Pertinent labs & imaging results that were available during my care of the patient were reviewed by me and considered in my medical decision making (see chart for details).  27 year old female presents with fever, chills, generalized body aches and a headache.  She is febrile to 101.5 in the ED.  Otherwise her vital signs are normal.  Her exam is overall unremarkable.  Will obtain a Chem-8, urine, strep.  Also give ibuprofen for  fever.  Labs are remarkable for mild hypokalemia.  Her strep is negative.  Her urine is normal.  She states her son got better in about 3 days.  Advised supportive care with rest, fluids, ibuprofen and Tylenol and to return in 2 to 3 days if her symptoms are not improving.  Final Clinical Impressions(s) / ED Diagnoses   Final diagnoses:  Febrile illness  Hypokalemia    ED Discharge Orders    None       Bethel Born, PA-C 03/23/18 1421    Bethel Born, PA-C 03/23/18 1501  Arby Barrette, MD 03/27/18 [phone]

## 2018-03-23 NOTE — ED Notes (Signed)
Pt arrived from waiting room with a pile of blankets on her. Blankets removed from pt.

## 2018-04-25 ENCOUNTER — Encounter (HOSPITAL_COMMUNITY): Payer: Self-pay

## 2018-04-25 ENCOUNTER — Other Ambulatory Visit: Payer: Self-pay

## 2018-04-25 ENCOUNTER — Emergency Department (HOSPITAL_COMMUNITY)
Admission: EM | Admit: 2018-04-25 | Discharge: 2018-04-25 | Disposition: A | Discharge status: Home / Self Care | Payer: Self-pay | Attending: Emergency Medicine | Admitting: Emergency Medicine

## 2018-04-25 DIAGNOSIS — Z79899 Other long term (current) drug therapy: Secondary | ICD-10-CM | POA: Insufficient documentation

## 2018-04-25 DIAGNOSIS — L03011 Cellulitis of right finger: Secondary | ICD-10-CM | POA: Insufficient documentation

## 2018-04-25 DIAGNOSIS — J029 Acute pharyngitis, unspecified: Secondary | ICD-10-CM | POA: Insufficient documentation

## 2018-04-25 LAB — GROUP A STREP BY PCR: GROUP A STREP BY PCR: NOT DETECTED

## 2018-04-25 LAB — POC URINE PREG, ED: PREG TEST UR: NEGATIVE

## 2018-04-25 MED ORDER — BACITRACIN ZINC 500 UNIT/GM EX OINT
TOPICAL_OINTMENT | CUTANEOUS | Status: AC
  Filled 2018-04-25: qty 0.9

## 2018-04-25 MED ORDER — CLINDAMYCIN HCL 150 MG PO CAPS: 300.0000 mg | ORAL_CAPSULE | Freq: Three times a day (TID) | ORAL | 0 refills | Status: AC

## 2018-04-25 NOTE — ED Provider Notes (Signed)
[facility]-EMERGENCY DEPT Provider Note   CSN: [id number] Arrival date & time: 04/25/18  4098     History   Chief Complaint Chief Complaint  Patient presents with  . Generalized Body Aches  . Headache  . Sore Throat  . Wound Infection    ri hand/ middle finger    HPI Ann Parsons is a 27 y.o. female.  27 year old female who presents with sore throat, body aches, and right middle finger pain.  Patient reports 1 week of body aches and approximately 4 days of sore throat associated with mild cough, nasal congestion, and nausea.  No associated vomiting and diarrhea.  She reports her kids have been sick at home recently.  She also notes several days of pain around her fingernail on her right middle finger.  No trauma or nail biting, no recent manicures.  No therapies tried prior to arrival.  The history is provided by the patient.    Past Medical History:  Diagnosis Date  . Chlamydia   . History of GBS (group B streptococcus) UTI, currently pregnant   . Scoliosis    mild    Patient Active Problem List   Diagnosis Date Noted  . Normal pregnancy, repeat 04/03/2016  . Breakthrough bleeding with IUD 10/05/2014    Past Surgical History:  Procedure Laterality Date  . DILATION AND CURETTAGE OF UTERUS    . NASAL SINUS SURGERY  08/2013     OB History    Gravida  3   Para  2   Term  2   Preterm      AB  1   Living  2     SAB      TAB  1   Ectopic      Multiple  0   Live Births  2            Home Medications    Prior to Admission medications   Medication Sig Start Date End Date Taking? Authorizing Provider  acetaminophen (TYLENOL) 500 MG tablet Take 1,000 mg by mouth every 6 (six) hours as needed for mild pain or headache.    [provider]  ibuprofen (ADVIL,MOTRIN) 600 MG tablet Take 1 tablet (600 mg total) by mouth every 6 (six) hours. 04/05/16   Meisinger, Todd, MD  loratadine (CLARITIN) 10 MG tablet Take 10 mg by  mouth daily as needed for allergies.    [provider]  Prenatal Vit-Fe Fumarate-FA (PRENATAL MULTIVITAMIN) TABS tablet Take 1 tablet by mouth daily at 12 noon.     [provider]    Family History Family History  Problem Relation Age of Onset  . Kidney disease Mother   . Lupus Mother   . Stroke Father     Social History Social History   Tobacco Use  . Smoking status: Never Smoker  . Smokeless tobacco: Never Used  Substance Use Topics  . Alcohol use: Yes    Comment: occasionally  . Drug use: No     Allergies   Patient has no known allergies.   Review of Systems Review of Systems All other systems reviewed and are negative except that which was mentioned in HPI   Physical Exam Updated Vital Signs Ht 5\' 4"  (1.626 m)   Wt 64.9 kg (143 lb)   SpO2 100%   BMI 24.55 kg/m   Physical Exam  Constitutional: She is oriented to person, place, and time. She appears well-developed and well-nourished. No distress.  HENT:  Head: Normocephalic and atraumatic.  Mouth/Throat: Uvula is midline and mucous membranes are normal. No uvula swelling. Oropharyngeal exudate and posterior oropharyngeal erythema present.  Moist mucous membranes  Eyes: Conjunctivae are normal.  Neck: Neck supple.  Cardiovascular: Normal rate, regular rhythm and normal heart sounds.  No murmur heard. Pulmonary/Chest: Effort normal and breath sounds normal.  Abdominal: Soft. Bowel sounds are normal. She exhibits no distension. There is no tenderness.  Musculoskeletal: She exhibits no edema.  Lymphadenopathy:    She has cervical adenopathy.  Neurological: She is alert and oriented to person, place, and time.  Fluent speech  Skin: Skin is warm and dry.  Paronychia radial side of R middle finger nail with mild edema of distal finger  Psychiatric: She has a normal mood and affect. Judgment normal.  Nursing note and vitals reviewed.    ED Treatments / Results  Labs (all labs ordered  are listed, but only abnormal results are displayed) Labs Reviewed  GROUP A STREP BY PCR  POC URINE PREG, ED    EKG None  Radiology No results found.  Procedures .Marland Kitchen.Incision and Drainage Date/Time: 04/25/2018 8:41 AM Performed by: Laurence SpatesLittle, Shihab States Morgan, MD Authorized by: Laurence SpatesLittle, Camila Maita Morgan, MD   Consent:    Consent obtained:  Verbal   Consent given by:  Patient   Risks discussed:  Bleeding and pain   Alternatives discussed:  No treatment Location:    Indications for incision and drainage: paronychia.   Location:  Upper extremity   Upper extremity location:  Finger   Finger location:  R long finger Pre-procedure details:    Skin preparation:  Betadine Anesthesia (see MAR for exact dosages):    Anesthesia method:  None Procedure details:    Incision types:  Stab incision   Incision depth:  Dermal   Scalpel blade:  10   Wound management:  Probed and deloculated   Drainage:  Purulent   Drainage amount:  Scant   Wound treatment:  Wound left open   Packing materials:  None Post-procedure details:    Patient tolerance of procedure:  Tolerated well, no immediate complications   (including critical care time)  Medications Ordered in ED Medications  bacitracin 500 UNIT/GM ointment (has no administration in time range)     Initial Impression / Assessment and Plan / ED Course  I have reviewed the triage vital signs and the nursing notes.  Pertinent labs that were available during my care of the patient were reviewed by me and considered in my medical decision making (see chart for details).     Well-appearing on exam.  She had a few white spots on her tonsils with no asymmetry or significant swelling to suggest peritonsillar abscess.  Small paronychia of right middle finger incised and drained, discussed supportive measures including warm soaks and bacitracin.  She does have some swelling of her fingertips, I do not feel it represents a felon but I have recommended a  course of antibiotics.  Her strep test is negative and given recent sick contacts I suspect viral process.  Discussed supportive measures and reviewed return precautions.  Final Clinical Impressions(s) / ED Diagnoses   Final diagnoses:  None    ED Discharge Orders    None       Saverio Kader, Ambrose Finlandachel Morgan, MD 04/25/18 [phone]

## 2018-04-25 NOTE — ED Notes (Signed)
RX X 1 GIVEN 

## 2018-04-25 NOTE — ED Triage Notes (Signed)
Pt requesting right hand middle finger be evaluated for nail infection.

## 2018-04-25 NOTE — ED Triage Notes (Signed)
Body aches x 1 week, sore throat not improved over 1 week with white patches. Denies fever. Nausea without vomiting. Headache yet has improved.

## 2018-05-27 ENCOUNTER — Encounter (HOSPITAL_COMMUNITY): Payer: Self-pay | Admitting: Emergency Medicine

## 2018-05-27 ENCOUNTER — Emergency Department (HOSPITAL_COMMUNITY)
Admission: EM | Admit: 2018-05-27 | Discharge: 2018-05-27 | Disposition: A | Discharge status: Home / Self Care | Payer: Self-pay | Attending: Emergency Medicine | Admitting: Emergency Medicine

## 2018-05-27 ENCOUNTER — Emergency Department (HOSPITAL_COMMUNITY): Payer: Self-pay

## 2018-05-27 DIAGNOSIS — M79641 Pain in right hand: Secondary | ICD-10-CM | POA: Insufficient documentation

## 2018-05-27 MED ORDER — CLINDAMYCIN HCL 150 MG PO CAPS: 150.0000 mg | ORAL_CAPSULE | Freq: Three times a day (TID) | ORAL | 0 refills | Status: AC

## 2018-05-27 MED ORDER — ACETAMINOPHEN 325 MG PO TABS
650.0000 mg | ORAL_TABLET | Freq: Once | ORAL | Status: AC
  Administered 2018-05-27: 650 mg via ORAL
  Filled 2018-05-27: qty 2

## 2018-05-27 NOTE — ED Provider Notes (Signed)
27-year-old female presents status post hand injury patch that [facility]-EMERGENCY DEPT Provider Note   CSN: [id number] Arrival date & time: 05/27/18  1048     History   Chief Complaint Chief Complaint  Patient presents with  . Hand Pain    HPI Ann Parsons is a 27 y.o. female.  27 year old with no past medical history presents with right hand pain which began 3 days ago.  Patient states she was cleaning the carpet when a piece of wood got stuck in her right middle finger.  Patient states she pulled a piece of wood out of her finger but noticed the swelling has gotten worse.  Patient describes his pain as throbbing on and off mainly located on her right hand middle digit, no radiation.  Patient has tried soaking her finger in warm water, states this has caused some relieve.  Patient states touching or bumping her finger makes this worse. Patient tetanus status not up to date. Patient denies any fever, abdominal pain, chest pain or shortness of breath.     Past Medical History:  Diagnosis Date  . Chlamydia   . History of GBS (group B streptococcus) UTI, currently pregnant   . Scoliosis    mild    Patient Active Problem List   Diagnosis Date Noted  . Normal pregnancy, repeat 04/03/2016  . Breakthrough bleeding with IUD 10/05/2014    Past Surgical History:  Procedure Laterality Date  . DILATION AND CURETTAGE OF UTERUS    . NASAL SINUS SURGERY  08/2013     OB History    Gravida  3   Para  2   Term  2   Preterm      AB  1   Living  2     SAB      TAB  1   Ectopic      Multiple  0   Live Births  2            Home Medications    Prior to Admission medications   Medication Sig Start Date End Date Taking? Authorizing Provider  clindamycin (CLEOCIN) 150 MG capsule Take 1 capsule (150 mg total) by mouth 3 (three) times daily for 7 days. 05/27/18 06/03/18  Claude Manges, PA-C    Family History Family History  Problem Relation  Age of Onset  . Kidney disease Mother   . Lupus Mother   . Stroke Father     Social History Social History   Tobacco Use  . Smoking status: Never Smoker  . Smokeless tobacco: Never Used  Substance Use Topics  . Alcohol use: Yes    Comment: occasionally  . Drug use: No     Allergies   Patient has no known allergies.   Review of Systems Review of Systems  Constitutional: Negative for chills and fever.  HENT: Negative for ear pain and sore throat.   Eyes: Negative for pain and visual disturbance.  Respiratory: Negative for cough and shortness of breath.   Cardiovascular: Negative for chest pain and palpitations.  Gastrointestinal: Negative for abdominal pain, diarrhea, nausea and vomiting.  Genitourinary: Negative for dysuria, flank pain, hematuria and pelvic pain.  Musculoskeletal: Negative for arthralgias, back pain and joint swelling.  Skin: Positive for wound. Negative for color change and rash.  Neurological: Negative for seizures and syncope.  All other systems reviewed and are negative.    Physical Exam Updated Vital Signs BP 133/81 (BP Location: Right Arm)   Pulse  64   Temp 98.4 F (36.9 C) (Oral)   Resp 16   LMP 05/20/2018   SpO2 100%   Physical Exam  Constitutional: She is oriented to person, place, and time. She appears well-developed and well-nourished. No distress.  HENT:  Head: Normocephalic and atraumatic.  Mouth/Throat: Oropharynx is clear and moist. No oropharyngeal exudate.  Eyes: Pupils are equal, round, and reactive to light. No scleral icterus.  Neck: Normal range of motion.  Cardiovascular: Regular rhythm and normal heart sounds.  Pulses:      Radial pulses are 2+ on the right side, and 2+ on the left side.  Pulmonary/Chest: Effort normal and breath sounds normal. No respiratory distress. She has no wheezes. She exhibits no tenderness.  Abdominal: Soft. Bowel sounds are normal. She exhibits no distension. There is no tenderness.    Musculoskeletal: She exhibits no deformity.       Right hand: She exhibits tenderness and swelling. Normal sensation noted. Decreased sensation is not present in the ulnar distribution, is not present in the medial distribution and is not present in the radial distribution. Decreased strength noted. She exhibits no finger abduction, no thumb/finger opposition and no wrist extension trouble.       Hands:      Right lower leg: She exhibits no edema.       Left lower leg: She exhibits no edema.  Neurological: She is alert and oriented to person, place, and time.  Skin: Skin is warm and dry. Capillary refill takes less than 2 seconds. Lesion noted. There is erythema.  See MSK section  Psychiatric: She has a normal mood and affect.  Nursing note and vitals reviewed.    ED Treatments / Results  Labs (all labs ordered are listed, but only abnormal results are displayed) Labs Reviewed - No data to display  EKG None  Radiology Dg Hand Complete Right  Result Date: 05/27/2018 CLINICAL DATA:  History of third finger injury with would 3 days ago. Swelling and redness. Pus noted. EXAM: RIGHT HAND - COMPLETE 3+ VIEW COMPARISON:  No prior. FINDINGS: Diffuse soft tissue swelling noted of the distal aspect of the right third digit. No radiopaque foreign body noted. No evidence of fracture or dislocation. No focal bony erosions noted. IMPRESSION: Soft tissue swelling noted the distal aspect of the right third digit. No radiopaque foreign body noted. No acute bony abnormality. No bony erosive changes are present. Electronically Signed   By: Maisie Fushomas  Register   On: 05/27/2018 13:04    Procedures Procedures (including critical care time)  Medications Ordered in ED Medications  acetaminophen (TYLENOL) tablet 650 mg (650 mg Oral Given 05/27/18 1307)     Initial Impression / Assessment and Plan / ED Course  I have reviewed the triage vital signs and the nursing notes.  Pertinent labs & imaging results  that were available during my care of the patient were reviewed by me and considered in my medical decision making (see chart for details).    Patient's fingers shows erythematous, edema present  but has no present fluctuant for drainage at this time. Patient hand xray showed no radiopaque foreign body. At this time I have advised patient we will treat this with clindamycin.Patient's vitals are stable, she is afebrile. Return precautions have been given.   Final Clinical Impressions(s) / ED Diagnoses   Final diagnoses:  Hand pain, right    ED Discharge Orders        Ordered    clindamycin (CLEOCIN) 150 MG  capsule  3 times daily     05/27/18 1413       Claude Manges, PA-C 05/27/18 1428    Tilden Fossa, MD 06/01/18 [phone]

## 2018-05-27 NOTE — Discharge Instructions (Signed)
I have prescribed antibiotics please 3 times a day for total of 7 days. If your experience any numbness, tingling or coldness in your fingers please return to the ED.If your symptoms worsen please return to the ED.

## 2018-05-27 NOTE — ED Notes (Signed)
Bed: WA02 Expected date:  Expected time:  Means of arrival:  Comments: 

## 2018-05-27 NOTE — ED Notes (Signed)
Patient given coffee. 

## 2018-05-27 NOTE — ED Triage Notes (Signed)
Patient here from home with complaints of right middle finger infection and pain. Reports getting a piece of wood stuck in it and swelling started 3 days ago.

## 2018-07-26 ENCOUNTER — Encounter (HOSPITAL_COMMUNITY): Payer: Self-pay

## 2018-07-26 ENCOUNTER — Emergency Department (HOSPITAL_COMMUNITY)
Admission: EM | Admit: 2018-07-26 | Discharge: 2018-07-26 | Disposition: A | Discharge status: Home / Self Care | Payer: Self-pay | Attending: Emergency Medicine | Admitting: Emergency Medicine

## 2018-07-26 ENCOUNTER — Emergency Department (HOSPITAL_COMMUNITY): Payer: Self-pay

## 2018-07-26 ENCOUNTER — Other Ambulatory Visit: Payer: Self-pay

## 2018-07-26 DIAGNOSIS — Z79899 Other long term (current) drug therapy: Secondary | ICD-10-CM | POA: Insufficient documentation

## 2018-07-26 DIAGNOSIS — R5383 Other fatigue: Secondary | ICD-10-CM

## 2018-07-26 DIAGNOSIS — J069 Acute upper respiratory infection, unspecified: Secondary | ICD-10-CM | POA: Insufficient documentation

## 2018-07-26 LAB — COMPREHENSIVE METABOLIC PANEL
ALT: 18 U/L (ref 0–44)
AST: 22 U/L (ref 15–41)
Albumin: 3.8 g/dL (ref 3.5–5.0)
Alkaline Phosphatase: 61 U/L (ref 38–126)
Anion gap: 8 (ref 5–15)
BILIRUBIN TOTAL: 0.6 mg/dL (ref 0.3–1.2)
BUN: 9 mg/dL (ref 6–20)
CHLORIDE: 105 mmol/L (ref 98–111)
CO2: 27 mmol/L (ref 22–32)
Calcium: 8.9 mg/dL (ref 8.9–10.3)
Creatinine, Ser: 0.91 mg/dL (ref 0.44–1.00)
GFR calc Af Amer: >60 mL/min (ref >60)
Glucose, Bld: 100 mg/dL — ABNORMAL HIGH (ref 70–99)
POTASSIUM: 3.7 mmol/L (ref 3.5–5.1)
Sodium: 140 mmol/L (ref 135–145)
TOTAL PROTEIN: 6.9 g/dL (ref 6.5–8.1)

## 2018-07-26 LAB — URINALYSIS, ROUTINE W REFLEX MICROSCOPIC
BILIRUBIN URINE: NEGATIVE
Glucose, UA: NEGATIVE mg/dL
HGB URINE DIPSTICK: NEGATIVE
Ketones, ur: NEGATIVE mg/dL
Leukocytes, UA: NEGATIVE
NITRITE: NEGATIVE
PROTEIN: NEGATIVE mg/dL
SPECIFIC GRAVITY, URINE: 1.017 (ref 1.005–1.030)
pH: 7 (ref 5.0–8.0)

## 2018-07-26 LAB — CBC WITH DIFFERENTIAL/PLATELET
BASOS ABS: 0 10*3/uL (ref 0.0–0.1)
Basophils Relative: 0 %
Eosinophils Absolute: 0.2 10*3/uL (ref 0.0–0.7)
Eosinophils Relative: 3 %
HEMATOCRIT: 37.5 % (ref 36.0–46.0)
Hemoglobin: 12.5 g/dL (ref 12.0–15.0)
LYMPHS ABS: 1.2 10*3/uL (ref 0.7–4.0)
LYMPHS PCT: 18 %
MCH: 31.3 pg (ref 26.0–34.0)
MCHC: 33.3 g/dL (ref 30.0–36.0)
MCV: 93.8 fL (ref 78.0–100.0)
MONO ABS: 0.6 10*3/uL (ref 0.1–1.0)
Monocytes Relative: 9 %
NEUTROS ABS: 4.6 10*3/uL (ref 1.7–7.7)
Neutrophils Relative %: 70 %
Platelets: 226 10*3/uL (ref 150–400)
RBC: 4 MIL/uL (ref 3.87–5.11)
RDW: 11.9 % (ref 11.5–15.5)
WBC: 6.6 10*3/uL (ref 4.0–10.5)

## 2018-07-26 LAB — HCG, QUANTITATIVE, PREGNANCY: hCG, Beta Chain, Quant, S: <1 m[IU]/mL (ref <5)

## 2018-07-26 LAB — I-STAT BETA HCG BLOOD, ED (MC, WL, AP ONLY): HCG, QUANTITATIVE: 5.1 m[IU]/mL — AB (ref <5)

## 2018-07-26 MED ORDER — SODIUM CHLORIDE 0.9 % IV BOLUS
1000.0000 mL | Freq: Once | INTRAVENOUS | Status: AC
  Administered 2018-07-26: 1000 mL via INTRAVENOUS

## 2018-07-26 NOTE — Discharge Instructions (Addendum)
Be sure to drink plenty of fluids.  You may take ibuprofen and/or Tylenol for pain/headache.  If you develop fever, neck stiffness, vomiting, shortness of breath, or any other new/concerning symptoms and return to the ER for evaluation.

## 2018-07-26 NOTE — ED Notes (Signed)
ED Provider at bedside. 

## 2018-07-26 NOTE — ED Triage Notes (Signed)
Patient c/o fatigue, nausea, and headache x 1 week.

## 2018-07-26 NOTE — ED Notes (Signed)
Ann Parsons in main lab stated they can add on quantitative to blood already in lab.

## 2018-07-26 NOTE — ED Notes (Signed)
Patient transported to X-ray 

## 2018-07-26 NOTE — ED Provider Notes (Signed)
[facility]-EMERGENCY DEPT Provider Note   CSN: [id number] Arrival date & time: 07/26/18  0803     History   Chief Complaint Chief Complaint  Patient presents with  . Fatigue  . Nausea  . Headache    HPI Ann Parsons is a 27 y.o. female.  HPI  27 year old female presents with a chief complaint of fatigue.  She states that this is been ongoing for about a week.  She feels like she has no energy and is sleeping more.  She since then developed nausea without vomiting.  There is no abdominal pain currently.  She is had some on and off lower abdominal cramping but is mild and not present currently.  There have been no fevers during this time.  Over the past few days she is developed a on and off frontal headache, mild cough, sore throat and rhinorrhea.  No body aches.  No shortness of breath.  LMP was 2 weeks ago and she has had a negative pregnancy test since then.  No vaginal bleeding or discharge.  No urinary symptoms.  Past Medical History:  Diagnosis Date  . Chlamydia   . History of GBS (group B streptococcus) UTI, currently pregnant   . Scoliosis    mild    Patient Active Problem List   Diagnosis Date Noted  . Normal pregnancy, repeat 04/03/2016  . Breakthrough bleeding with IUD 10/05/2014    Past Surgical History:  Procedure Laterality Date  . DILATION AND CURETTAGE OF UTERUS    . NASAL SINUS SURGERY  08/2013     OB History    Gravida  3   Para  2   Term  2   Preterm      AB  1   Living  2     SAB      TAB  1   Ectopic      Multiple  0   Live Births  2            Home Medications    Prior to Admission medications   Medication Sig Start Date End Date Taking? Authorizing Provider  Multiple Vitamin (MULTIVITAMIN WITH MINERALS) TABS tablet Take 1 tablet by mouth daily.   Yes [provider]    Family History Family History  Problem Relation Age of Onset  . Kidney disease Mother   . Lupus Mother   .  Stroke Father     Social History Social History   Tobacco Use  . Smoking status: Never Smoker  . Smokeless tobacco: Never Used  Substance Use Topics  . Alcohol use: Yes    Comment: occasionally  . Drug use: No     Allergies   Patient has no known allergies.   Review of Systems Review of Systems  Constitutional: Positive for fatigue. Negative for fever.  HENT: Positive for rhinorrhea and sore throat. Negative for ear pain.   Respiratory: Positive for cough. Negative for shortness of breath.   Cardiovascular: Negative for chest pain.  Gastrointestinal: Positive for diarrhea and nausea. Negative for vomiting.  Genitourinary: Negative for dysuria, menstrual problem, vaginal bleeding and vaginal discharge.  Neurological: Positive for headaches.  All other systems reviewed and are negative.    Physical Exam Updated Vital Signs BP 110/72 (BP Location: Right Arm)   Pulse 78   Temp 98.6 F (37 C) (Oral)   Resp 16   Ht 5\' 4"  (1.626 m)   Wt 64.4 kg   LMP 07/12/2018 (Approximate)  SpO2 99%   BMI 24.37 kg/m   Physical Exam  Constitutional: She is oriented to person, place, and time. She appears well-developed and well-nourished.  Non-toxic appearance. She does not appear ill. No distress.  HENT:  Head: Normocephalic and atraumatic.  Right Ear: Tympanic membrane and external ear normal.  Left Ear: Tympanic membrane and external ear normal.  Nose: Nose normal.  Eyes: Pupils are equal, round, and reactive to light. EOM are normal. Right eye exhibits no discharge. Left eye exhibits no discharge.  Neck: Neck supple.  Cardiovascular: Normal rate, regular rhythm and normal heart sounds.  Pulmonary/Chest: Effort normal and breath sounds normal.  Abdominal: Soft. There is no tenderness.  Neurological: She is alert and oriented to person, place, and time.  CN 3-12 grossly intact. 5/5 strength in all 4 extremities. Grossly normal sensation. Normal finger to nose.   Skin: Skin is  warm and dry.  Nursing note and vitals reviewed.    ED Treatments / Results  Labs (all labs ordered are listed, but only abnormal results are displayed) Labs Reviewed  COMPREHENSIVE METABOLIC PANEL - Abnormal; Notable for the following components:      Result Value   Glucose, Bld 100 (*)    All other components within normal limits  I-STAT BETA HCG BLOOD, ED ([facility], [facility], [facility] ONLY) - Abnormal; Notable for the following components:   I-stat hCG, quantitative 5.1 (*)    All other components within normal limits  URINALYSIS, ROUTINE W REFLEX MICROSCOPIC  CBC WITH DIFFERENTIAL/PLATELET  HCG, QUANTITATIVE, PREGNANCY    EKG None  Radiology Dg Chest 2 View  Result Date: 07/26/2018 CLINICAL DATA:  Fatigue and nausea for 1 week EXAM: CHEST - 2 VIEW COMPARISON:  10/14/2013 FINDINGS: The heart size and mediastinal contours are within normal limits. Both lungs are clear. The visualized skeletal structures are unremarkable. IMPRESSION: No active cardiopulmonary disease. Electronically Signed   By: Alcide Clever M.D.   On: 07/26/2018 09:52    Procedures Procedures (including critical care time)  Medications Ordered in ED Medications  sodium chloride 0.9 % bolus 1,000 mL (0 mLs Intravenous Stopped 07/26/18 0939)     Initial Impression / Assessment and Plan / ED Course  I have reviewed the triage vital signs and the nursing notes.  Pertinent labs & imaging results that were available during my care of the patient were reviewed by me and considered in my medical decision making (see chart for details).     No clear etiology for the patient's fatigue.  Her lab work is unremarkable.  At first her i-STAT beta-hCG was slightly positive but I think this is lab error as a formal hCG quant is negative.  Otherwise labs benign.  Exam is also benign.  Highly doubt acute bacterial illness, especially meningitis.  Headache is of unclear etiology but with no focal neuro deficits and waxing and waning  moderate headache I do not think emergent CT is warranted.  Discharged home with return precautions.  Final Clinical Impressions(s) / ED Diagnoses   Final diagnoses:  Fatigue, unspecified type  Viral upper respiratory infection    ED Discharge Orders    None       Pricilla Loveless, MD 07/26/18 1044

## 2018-07-26 NOTE — ED Notes (Addendum)
Went to see if patient needed assistance on getting back out of department. Patient states that she has an issue... States that her boyfriend was seen Thursday for an outbreak on his penis and STD testing came back fine when they looked on Mychart Saturday, but his herpes results have just resulted and are positive.  Pt wanting to see EDP again.  Criss AlvineGoldston EDP made aware of patient wanting to see him again.

## 2018-11-02 NOTE — L&D Delivery Note (Signed)
Delivery Note Pt 9.5cm with uncontrollable urge to push. After 2-3 uncontrolled pushes, SROm noted with copious clear fluid. Pt complete. At 11:46 PM a viable female was delivered via Vaginal, Spontaneous (Presentation:LOA ;  ).  APGAR:9 ,9 ; weight pending.   Placenta status:delivered, intact schultz, .  Cord:3vc  with the following complications:none .  Cord pH: n/a  Anesthesia:  none Episiotomy: None Lacerations: None Suture Repair: none Est. Blood Loss (mL):  182ml  Mom to postpartum.  Baby to Couplet care / Skin to Skin.  Isaiah Serge 09/01/2019, 12:04 AM

## 2019-01-12 ENCOUNTER — Encounter (HOSPITAL_COMMUNITY): Payer: Self-pay | Admitting: *Deleted

## 2019-01-12 ENCOUNTER — Other Ambulatory Visit: Payer: Self-pay

## 2019-01-12 ENCOUNTER — Inpatient Hospital Stay (HOSPITAL_COMMUNITY)
Admission: AD | Admit: 2019-01-12 | Discharge: 2019-01-12 | Disposition: A | Discharge status: Home / Self Care | Payer: BLUE CROSS/BLUE SHIELD | Attending: Obstetrics and Gynecology | Admitting: Obstetrics and Gynecology

## 2019-01-12 DIAGNOSIS — O210 Mild hyperemesis gravidarum: Secondary | ICD-10-CM | POA: Insufficient documentation

## 2019-01-12 DIAGNOSIS — Z3A01 Less than 8 weeks gestation of pregnancy: Secondary | ICD-10-CM | POA: Insufficient documentation

## 2019-01-12 LAB — URINALYSIS, ROUTINE W REFLEX MICROSCOPIC
Bilirubin Urine: NEGATIVE
GLUCOSE, UA: NEGATIVE mg/dL
HGB URINE DIPSTICK: NEGATIVE
Ketones, ur: NEGATIVE mg/dL
LEUKOCYTE UA: NEGATIVE
Nitrite: NEGATIVE
PH: 7 (ref 5.0–8.0)
PROTEIN: NEGATIVE mg/dL
SPECIFIC GRAVITY, URINE: 1.019 (ref 1.005–1.030)

## 2019-01-12 LAB — POCT PREGNANCY, URINE: Preg Test, Ur: POSITIVE — AB

## 2019-01-12 MED ORDER — METOCLOPRAMIDE HCL 10 MG PO TABS
10.0000 mg | ORAL_TABLET | Freq: Once | ORAL | Status: AC
  Administered 2019-01-12: 10 mg via ORAL
  Filled 2019-01-12: qty 1

## 2019-01-12 MED ORDER — METOCLOPRAMIDE HCL 10 MG PO TABS: 10.0000 mg | ORAL_TABLET | Freq: Four times a day (QID) | ORAL | 1 refills | Status: DC

## 2019-01-12 MED ORDER — DOXYLAMINE-PYRIDOXINE ER 20-20 MG PO TBCR: 1.0000 | EXTENDED_RELEASE_TABLET | Freq: Two times a day (BID) | ORAL | 0 refills | Status: DC

## 2019-01-12 NOTE — MAU Note (Signed)
Presents with c/o N/V for 4 days.  Reports unable to keep anything down.  LMP 11/26/2018.  +HPT.

## 2019-01-12 NOTE — Discharge Instructions (Signed)
Hyperemesis Gravidarum  Hyperemesis gravidarum is a severe form of nausea and vomiting that happens during pregnancy. Hyperemesis is worse than morning sickness. It may cause you to have nausea or vomiting all day for many days. It may keep you from eating and drinking enough food and liquids, which can lead to dehydration, malnutrition, and weight loss. Hyperemesis usually occurs during the first half (the first 20 weeks) of pregnancy. It often goes away once a woman is in her second half of pregnancy. However, sometimes hyperemesis continues through an entire pregnancy.  What are the causes?  The cause of this condition is not known. It may be related to changes in chemicals (hormones) in the body during pregnancy, such as the high level of pregnancy hormone (human chorionic gonadotropin) or the increase in the female sex hormone (estrogen).  What are the signs or symptoms?  Symptoms of this condition include:  Nausea that does not go away.  Vomiting that does not allow you to keep any food down.  Weight loss.  Body fluid loss (dehydration).  Having no desire to eat, or not liking food that you have previously enjoyed.  How is this diagnosed?  This condition may be diagnosed based on:  A physical exam.  Your medical history.  Your symptoms.  Blood tests.  Urine tests.  How is this treated?  This condition is managed by controlling symptoms. This may include:  Following an eating plan. This can help lessen nausea and vomiting.  Taking prescription medicines.  An eating plan and medicines are often used together to help control symptoms. If medicines do not help relieve nausea and vomiting, you may need to receive fluids through an IV at the hospital.  Follow these instructions at home:  Eating and drinking    Avoid the following:  Drinking fluids with meals. Try not to drink anything during the 30 minutes before and after your meals.  Drinking more than 1 cup of fluid at a time.  Eating foods that trigger your  symptoms. These may include spicy foods, coffee, high-fat foods, very sweet foods, and acidic foods.  Skipping meals. Nausea can be more intense on an empty stomach. If you cannot tolerate food, do not force it. Try sucking on ice chips or other frozen items and make up for missed calories later.  Lying down within 2 hours after eating.  Being exposed to environmental triggers. These may include food smells, smoky rooms, closed spaces, rooms with strong smells, warm or humid places, overly loud and noisy rooms, and rooms with motion or flickering lights. Try eating meals in a well-ventilated area that is free of strong smells.  Quick and sudden changes in your movement.  Taking iron pills and multivitamins that contain iron. If you take prescription iron pills, do not stop taking them unless your health care provider approves.  Preparing food. The smell of food can spoil your appetite or trigger nausea.  To help relieve your symptoms:  Listen to your body. Everyone is different and has different preferences. Find what works best for you.  Eat and drink slowly.  Eat 5-6 small meals daily instead of 3 large meals. Eating small meals and snacks can help you avoid an empty stomach.  In the morning, before getting out of bed, eat a couple of crackers to avoid moving around on an empty stomach.  Try eating starchy foods as these are usually tolerated well. Examples include cereal, toast, bread, potatoes, pasta, rice, and pretzels.  Include at   least 1 serving of protein with your meals and snacks. Protein options include lean meats, poultry, seafood, beans, nuts, nut butters, eggs, cheese, and yogurt.  Try eating a protein-rich snack before bed. Examples of a protein-rick snack include cheese and crackers or a peanut butter sandwich made with 1 slice of whole-wheat bread and 1 tsp (5 g) of peanut butter.  Eat or suck on things that have ginger in them. It may help relieve nausea. Add  tsp ground ginger to hot tea or  choose ginger tea.  Try drinking 100% fruit juice or an electrolyte drink. An electrolyte drink contains sodium, potassium, and chloride.  Drink fluids that are cold, clear, and carbonated or sour. Examples include lemonade, ginger ale, lemon-lime soda, ice water, and sparkling water.  Brush your teeth or use a mouth rinse after meals.  Talk with your health care provider about starting a supplement of vitamin B6.  General instructions  Take over-the-counter and prescription medicines only as told by your health care provider.  Follow instructions from your health care provider about eating or drinking restrictions.  Continue to take your prenatal vitamins as told by your health care provider. If you are having trouble taking your prenatal vitamins, talk with your health care provider about different options.  Keep all follow-up and pre-birth (prenatal) visits as told by your health care provider. This is important.  Contact a health care provider if:  You have pain in your abdomen.  You have a severe headache.  You have vision problems.  You are losing weight.  You feel weak or dizzy.  Get help right away if:  You cannot drink fluids without vomiting.  You vomit blood.  You have constant nausea and vomiting.  You are very weak.  You faint.  You have a fever and your symptoms suddenly get worse.  Summary  Hyperemesis gravidarum is a severe form of nausea and vomiting that happens during pregnancy.  Making some changes to your eating habits may help relieve nausea and vomiting.  This condition may be managed with medicine.  If medicines do not help relieve nausea and vomiting, you may need to receive fluids through an IV at the hospital.  This information is not intended to replace advice given to you by your health care provider. Make sure you discuss any questions you have with your health care provider.  Document Released: 10/19/2005 Document Revised: 11/08/2017 Document Reviewed: 06/17/2016  [employer] Interactive  Patient Education  2019 [employer]

## 2019-01-12 NOTE — MAU Provider Note (Addendum)
History    Patient Ann Parsons is a 28 y.o. [phone] At [redacted]w[redacted]d here with complaints of nausea and vomiting for 4 days. She denies vaginal pain, vaginal discharge, vaginal bleeding, abdominal pain.   CSN: [id number]  Arrival date and time: 01/12/19 1255   First Provider Initiated Contact with Patient 01/12/19 1402      Chief Complaint  Patient presents with  . Nausea  . Emesis   Emesis   This is a new problem. The current episode started in the past 7 days. The problem occurs 5 to 10 times per day. The problem has been gradually worsening. The emesis has an appearance of stomach contents. There has been no fever. Associated symptoms include diarrhea. Pertinent negatives include no abdominal pain, chills or coughing.   She has had some diarrhea but it is getting more formed. She denies sick contacts.   Yesterday she had hibachi, crab legs, a bowl of cereal, chicken noddle soup. She threw up much later after eating this. Today she ate cereal at 7 am and has not felt like eating since then.  OB History    Gravida  4   Para  2   Term  2   Preterm      AB  1   Living  2     SAB      TAB  1   Ectopic      Multiple  0   Live Births  2           Past Medical History:  Diagnosis Date  . Chlamydia   . History of GBS (group B streptococcus) UTI, currently pregnant   . Scoliosis    mild    Past Surgical History:  Procedure Laterality Date  . DILATION AND CURETTAGE OF UTERUS    . NASAL SINUS SURGERY  08/2013    Family History  Problem Relation Age of Onset  . Kidney disease Mother   . Lupus Mother   . Stroke Father     Social History   Tobacco Use  . Smoking status: Never Smoker  . Smokeless tobacco: Never Used  Substance Use Topics  . Alcohol use: Yes    Comment: occasionally  . Drug use: Yes    Types: Marijuana    Comment: last smoked 01/11/2019    Allergies: No Known Allergies  Medications Prior to Admission  Medication Sig Dispense Refill  Last Dose  . Multiple Vitamin (MULTIVITAMIN WITH MINERALS) TABS tablet Take 1 tablet by mouth daily.   07/25/2018 at Unknown time    Review of Systems  Constitutional: Negative for chills.  Respiratory: Negative for cough.   Gastrointestinal: Positive for diarrhea and vomiting. Negative for abdominal pain.  Genitourinary: Negative.   Neurological: Negative.    Physical Exam   Blood pressure 126/87, pulse 70, temperature 98.2 F (36.8 C), temperature source Oral, resp. rate 20, height 5\' 4"  (1.626 m), weight 67.6 kg, last menstrual period 11/26/2018, SpO2 100 %.  Physical Exam  Constitutional: She is oriented to person, place, and time. She appears well-developed and well-nourished.  HENT:  Head: Normocephalic.  Neck: Normal range of motion.  Cardiovascular: Normal rate.  GI: Soft.  Musculoskeletal: Normal range of motion.  Neurological: She is alert and oriented to person, place, and time.  Skin: Skin is warm and dry.    MAU Course  Procedures  MDM Will give Reglan 10 mg, and PO challenge.  Patient tolerated PO challenge well, no vomiting or diarrhea while  in MAU.  UA negative for ketones.   Assessment and Plan   1. Morning sickness    2. Patient stable for discharge with RX for Reglan and diet suggestions.   3. Patient will make NOB appt with her private MD.   4. Return to MAU if any severe nausea/vomiting/diarrhea or dehydration or other concerning symptoms.   Charlesetta Garibaldi Kooistra 01/12/2019, 2:05 PM

## 2019-02-03 LAB — OB RESULTS CONSOLE HEPATITIS B SURFACE ANTIGEN: Hepatitis B Surface Ag: NEGATIVE

## 2019-02-03 LAB — OB RESULTS CONSOLE ABO/RH: RH Type: NEGATIVE

## 2019-02-03 LAB — OB RESULTS CONSOLE ANTIBODY SCREEN: Antibody Screen: NEGATIVE

## 2019-02-03 LAB — OB RESULTS CONSOLE RPR: RPR: NONREACTIVE

## 2019-02-03 LAB — OB RESULTS CONSOLE HIV ANTIBODY (ROUTINE TESTING): HIV: NONREACTIVE

## 2019-02-03 LAB — OB RESULTS CONSOLE GC/CHLAMYDIA
Chlamydia: NEGATIVE
Gonorrhea: NEGATIVE

## 2019-02-03 LAB — OB RESULTS CONSOLE RUBELLA ANTIBODY, IGM: Rubella: NON-IMMUNE/NOT IMMUNE

## 2019-08-10 LAB — OB RESULTS CONSOLE GBS: GBS: POSITIVE

## 2019-08-28 ENCOUNTER — Telehealth (HOSPITAL_COMMUNITY): Payer: Self-pay | Admitting: *Deleted

## 2019-08-28 ENCOUNTER — Encounter (HOSPITAL_COMMUNITY): Payer: Self-pay | Admitting: *Deleted

## 2019-08-28 NOTE — Telephone Encounter (Signed)
Preadmission screen  

## 2019-08-30 ENCOUNTER — Other Ambulatory Visit (HOSPITAL_COMMUNITY)
Admission: RE | Admit: 2019-08-30 | Discharge: 2019-08-30 | Disposition: A | Discharge status: Home / Self Care | Payer: Medicaid Other | Source: Ambulatory Visit | Attending: Obstetrics and Gynecology | Admitting: Obstetrics and Gynecology

## 2019-08-30 ENCOUNTER — Other Ambulatory Visit: Payer: Self-pay

## 2019-08-30 DIAGNOSIS — Z20828 Contact with and (suspected) exposure to other viral communicable diseases: Secondary | ICD-10-CM | POA: Insufficient documentation

## 2019-08-30 DIAGNOSIS — Z01812 Encounter for preprocedural laboratory examination: Secondary | ICD-10-CM | POA: Insufficient documentation

## 2019-08-30 LAB — SARS CORONAVIRUS 2 (TAT 6-24 HRS): SARS Coronavirus 2: NEGATIVE

## 2019-08-30 NOTE — MAU Note (Signed)
Asymptomatic, swab collected. 

## 2019-08-31 ENCOUNTER — Encounter (HOSPITAL_COMMUNITY): Payer: Self-pay

## 2019-08-31 ENCOUNTER — Other Ambulatory Visit: Payer: Self-pay

## 2019-08-31 ENCOUNTER — Other Ambulatory Visit: Payer: Self-pay | Admitting: Obstetrics and Gynecology

## 2019-08-31 ENCOUNTER — Inpatient Hospital Stay (EMERGENCY_DEPARTMENT_HOSPITAL)
Admission: AD | Admit: 2019-08-31 | Discharge: 2019-08-31 | Disposition: A | Discharge status: Home / Self Care | Payer: Medicaid Other | Source: Home / Self Care | Attending: Obstetrics and Gynecology | Admitting: Obstetrics and Gynecology

## 2019-08-31 ENCOUNTER — Inpatient Hospital Stay (HOSPITAL_COMMUNITY)
Admission: AD | Admit: 2019-08-31 | Discharge: 2019-09-02 | DRG: 807 | Disposition: A | Discharge status: Home / Self Care | Payer: Medicaid Other | Attending: Obstetrics and Gynecology | Admitting: Obstetrics and Gynecology

## 2019-08-31 DIAGNOSIS — Z20828 Contact with and (suspected) exposure to other viral communicable diseases: Secondary | ICD-10-CM | POA: Diagnosis present

## 2019-08-31 DIAGNOSIS — Z6791 Unspecified blood type, Rh negative: Secondary | ICD-10-CM

## 2019-08-31 DIAGNOSIS — Z3A39 39 weeks gestation of pregnancy: Secondary | ICD-10-CM | POA: Insufficient documentation

## 2019-08-31 DIAGNOSIS — O99824 Streptococcus B carrier state complicating childbirth: Secondary | ICD-10-CM | POA: Diagnosis present

## 2019-08-31 DIAGNOSIS — O471 False labor at or after 37 completed weeks of gestation: Secondary | ICD-10-CM

## 2019-08-31 DIAGNOSIS — O99891 Other specified diseases and conditions complicating pregnancy: Secondary | ICD-10-CM | POA: Insufficient documentation

## 2019-08-31 DIAGNOSIS — M419 Scoliosis, unspecified: Secondary | ICD-10-CM | POA: Diagnosis present

## 2019-08-31 DIAGNOSIS — O26893 Other specified pregnancy related conditions, third trimester: Secondary | ICD-10-CM | POA: Diagnosis present

## 2019-08-31 DIAGNOSIS — O479 False labor, unspecified: Secondary | ICD-10-CM

## 2019-08-31 DIAGNOSIS — N898 Other specified noninflammatory disorders of vagina: Secondary | ICD-10-CM | POA: Insufficient documentation

## 2019-08-31 LAB — CBC
HCT: 31.8 % — ABNORMAL LOW (ref 36.0–46.0)
Hemoglobin: 10.5 g/dL — ABNORMAL LOW (ref 12.0–15.0)
MCH: 30.3 pg (ref 26.0–34.0)
MCHC: 33 g/dL (ref 30.0–36.0)
MCV: 91.6 fL (ref 80.0–100.0)
Platelets: 252 10*3/uL (ref 150–400)
RBC: 3.47 MIL/uL — ABNORMAL LOW (ref 3.87–5.11)
RDW: 12.9 % (ref 11.5–15.5)
WBC: 11.1 10*3/uL — ABNORMAL HIGH (ref 4.0–10.5)
nRBC: 0 % (ref 0.0–0.2)

## 2019-08-31 LAB — AMNISURE RUPTURE OF MEMBRANE (ROM) NOT AT ARMC: Amnisure ROM: NEGATIVE

## 2019-08-31 LAB — POCT FERN TEST: POCT Fern Test: NEGATIVE

## 2019-08-31 LAB — TYPE AND SCREEN
ABO/RH(D): A NEG
Antibody Screen: NEGATIVE

## 2019-08-31 LAB — ABO/RH: ABO/RH(D): A NEG

## 2019-08-31 MED ORDER — FLEET ENEMA 7-19 GM/118ML RE ENEM: 1.0000 | ENEMA | RECTAL | Status: DC | PRN

## 2019-08-31 MED ORDER — OXYTOCIN BOLUS FROM INFUSION
500.0000 mL | Freq: Once | INTRAVENOUS | Status: AC
  Administered 2019-08-31: 500 mL via INTRAVENOUS

## 2019-08-31 MED ORDER — OXYCODONE-ACETAMINOPHEN 5-325 MG PO TABS: 2.0000 | ORAL_TABLET | ORAL | Status: DC | PRN

## 2019-08-31 MED ORDER — LACTATED RINGERS IV SOLN
INTRAVENOUS | Status: DC
  Administered 2019-08-31: 23:00:00 via INTRAVENOUS

## 2019-08-31 MED ORDER — SOD CITRATE-CITRIC ACID 500-334 MG/5ML PO SOLN: 30.0000 mL | ORAL | Status: DC | PRN

## 2019-08-31 MED ORDER — FENTANYL-BUPIVACAINE-NACL 0.5-0.125-0.9 MG/250ML-% EP SOLN: 12.0000 mL/h | EPIDURAL | Status: DC | PRN

## 2019-08-31 MED ORDER — EPHEDRINE 5 MG/ML INJ: 10.0000 mg | INTRAVENOUS | Status: DC | PRN

## 2019-08-31 MED ORDER — SODIUM CHLORIDE 0.9 % IV SOLN
2.0000 g | Freq: Once | INTRAVENOUS | Status: AC
  Administered 2019-08-31: 2 g via INTRAVENOUS

## 2019-08-31 MED ORDER — OXYCODONE-ACETAMINOPHEN 5-325 MG PO TABS: 1.0000 | ORAL_TABLET | ORAL | Status: DC | PRN

## 2019-08-31 MED ORDER — OXYTOCIN 40 UNITS IN NORMAL SALINE INFUSION - SIMPLE MED
INTRAVENOUS | Status: AC
  Administered 2019-08-31: 500 mL via INTRAVENOUS
  Filled 2019-08-31: qty 1000

## 2019-08-31 MED ORDER — OXYTOCIN 40 UNITS IN NORMAL SALINE INFUSION - SIMPLE MED: 2.5000 [IU]/h | INTRAVENOUS | Status: DC

## 2019-08-31 MED ORDER — LACTATED RINGERS IV SOLN
500.0000 mL | Freq: Once | INTRAVENOUS | Status: AC
  Administered 2019-08-31: 500 mL via INTRAVENOUS

## 2019-08-31 MED ORDER — ONDANSETRON HCL 4 MG/2ML IJ SOLN: 4.0000 mg | Freq: Four times a day (QID) | INTRAMUSCULAR | Status: DC | PRN

## 2019-08-31 MED ORDER — ACETAMINOPHEN 325 MG PO TABS: 650.0000 mg | ORAL_TABLET | ORAL | Status: DC | PRN

## 2019-08-31 MED ORDER — LIDOCAINE HCL (PF) 1 % IJ SOLN: 30.0000 mL | INTRAMUSCULAR | Status: DC | PRN

## 2019-08-31 MED ORDER — PHENYLEPHRINE 40 MCG/ML (10ML) SYRINGE FOR IV PUSH (FOR BLOOD PRESSURE SUPPORT): 80.0000 ug | PREFILLED_SYRINGE | INTRAVENOUS | Status: DC | PRN

## 2019-08-31 MED ORDER — DIPHENHYDRAMINE HCL 50 MG/ML IJ SOLN: 12.5000 mg | INTRAMUSCULAR | Status: DC | PRN

## 2019-08-31 MED ORDER — LACTATED RINGERS IV SOLN: 500.0000 mL | INTRAVENOUS | Status: DC | PRN

## 2019-08-31 NOTE — MAU Note (Signed)
Pt presents to MAU with c/o PROM. Has had slow leaking of fluid today, denies pain. Pt reports bloody show. +FM

## 2019-08-31 NOTE — MAU Note (Signed)
Pt arrived to MAU in active labor 8cm  expedited transfer to l&d with Dr. Marice Potter at bedside for transport.

## 2019-08-31 NOTE — Progress Notes (Signed)
Patient ID: Ann Parsons, female   DOB: 1991/08/17, 28 y.o.   MRN: [id number] Pt arrived on MAU at 8cm dilated; bulging membranes  Admit for expectant mgmt On arrival to assess pt, she is now 9cm and 0 station.  GBS + - started on ampicillin. Will wait to AROM Sars Covid negative Expectant mgmt

## 2019-08-31 NOTE — Discharge Instructions (Signed)

## 2019-08-31 NOTE — H&P (Unsigned)
Ann Parsons is a 28 y.o. femaleG4P2012 at 61 weeks (EDD 11/4//20 by LMP c/w  presenting for ***. OB History    Gravida  4   Para  2   Term  2   Preterm      AB  1   Living  2     SAB      TAB  1   Ectopic      Multiple  0   Live Births  2          Past Medical History:  Diagnosis Date  . Chlamydia   . History of GBS (group B streptococcus) UTI, currently pregnant   . Scoliosis    mild   Past Surgical History:  Procedure Laterality Date  . DILATION AND CURETTAGE OF UTERUS    . NASAL SINUS SURGERY  08/2013   Family History: family history includes Kidney disease in her mother; Lupus in her mother; Stroke in her father. Social History:  reports that she has never smoked. She has never used smokeless tobacco. She reports current alcohol use. She reports current drug use. Drug: Marijuana.     Maternal Diabetes: {Maternal Diabetes:3043596} Genetic Screening: {Genetic Screening:20205} Maternal Ultrasounds/Referrals: {Maternal Ultrasounds / Referrals:20211} Fetal Ultrasounds or other Referrals:  {Fetal Ultrasounds or Other Referrals:20213} Maternal Substance Abuse:  {Maternal Substance Abuse:20223} Significant Maternal Medications:  {Significant Maternal Meds:20233} Significant Maternal Lab Results:  {Significant Maternal Lab Results:20235} Other Comments:  {Other Comments:20251}  Review of Systems  Constitutional: Negative for fever.  Gastrointestinal: Negative for abdominal pain.   Maternal Medical History:  Contractions: Frequency: irregular.   Perceived severity is mild.    Fetal activity: Perceived fetal activity is normal.    Prenatal complications: HSv dx at 31 weeks, on suppression  Prenatal Complications - Diabetes: none.      Last menstrual period 11/26/2018. Maternal Exam:  Uterine Assessment: Contraction strength is mild.  Contraction frequency is irregular.   Abdomen: Patient reports no abdominal tenderness. Fetal presentation:  vertex     Physical Exam  Constitutional: She appears well-developed.  Cardiovascular: Normal rate and regular rhythm.  Respiratory: Effort normal.  GI: Soft.  Genitourinary:    Vagina normal.   Neurological: She is alert.  Psychiatric: She has a normal mood and affect.    Prenatal labs: ABO, Rh: A/Negative/-- (04/03 0000) Antibody: Negative (04/03 0000) Rubella: Nonimmune (04/03 0000) RPR: Nonreactive (04/03 0000)  HBsAg: Negative (04/03 0000)  HIV: Non-reactive (04/03 0000)  GBS: Positive/-- (10/08 0000)   Assessment/Plan: Logan Bores 08/31/2019, 8:02 PM

## 2019-08-31 NOTE — MAU Provider Note (Signed)
S: Ms. Ann Parsons is a 28 y.o. [phone] at [redacted]w[redacted]d  who presents to MAU today complaining of leaking of fluid since earlier today. She endorses vaginal bleeding, light. She denies contractions. She reports normal fetal movement.    O: BP 134/82 (BP Location: Right Arm)   Pulse 90   Temp 97.9 F (36.6 C) (Oral)   Resp 16   LMP 11/26/2018 (Approximate)   SpO2 100%  GENERAL: Well-developed, well-nourished female in no acute distress.  HEAD: Normocephalic, atraumatic.  CHEST: Normal effort of breathing, regular heart rate ABDOMEN: Soft, nontender, gravid PELVIC: Normal external female genitalia. Vagina is pink and rugated. Cervix with normal contour, no lesions. Normal discharge.  Negative pooling.   Cervical exam:  Dilation: 5 Effacement (%): 70 Cervical Position: Middle Station: -2 Presentation: Vertex Exam by:: B. Bowen, RN   Fetal Monitoring: Baseline: 140 bpm Variability: moderate Accelerations: 15 x 15 Decelerations: none Contractions: q 4-5 minutes  Results for orders placed or performed during the hospital encounter of 08/31/19 (from the past 24 hour(s))  POCT fern test     Status: None   Collection Time: 08/31/19  2:50 PM  Result Value Ref Range   POCT Fern Test Negative = intact amniotic membranes   Amnisure rupture of membrane (rom)not at [facility]     Status: None   Collection Time: 08/31/19  3:06 PM  Result Value Ref Range   Amnisure ROM NEGATIVE    MDM Ann Parsons - negative SSE appears scant clear fluid without pooling Amnisure ordered to confirm prior to disposition   A: [facility] at [redacted]w[redacted]d  Membranes intact  P: Discharge home Labor precautions and kick counts included on AVS Patient advised to follow-up with [facility] as scheduled  Patient may return to MAU as needed or if her condition were to change or worsen   Luvenia Redden, PA-C 08/31/2019 3:44 PM

## 2019-08-31 NOTE — H&P (Signed)
Ann Parsons is a 28 y.o. female (EDD 09/06/19 by LMP c/w 9 week [place] ) at 39 5/7 weeks  presenting in active labor at [place].   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  Also GBS positive.  Rh negative and received rhogam.  Interested in pp [place] and papers signed.           OB History    Gravida  4   Para  2   Term  2   Preterm      AB  1   Living  2     SAB      TAB  1   Ectopic      Multiple  0   Live Births  2         08-30-2011, 38 wks 1. M, 8lbs 6oz, Vaginal Delivery 04-03-2016, 39.2 wks 1. F, 8lbs, NSVD EAB x 1      Past Medical History:  Diagnosis Date  . Chlamydia   . History of GBS (group B streptococcus) UTI, currently pregnant   . Scoliosis    mild        Past Surgical History:  Procedure Laterality Date  . DILATION AND CURETTAGE OF UTERUS    . NASAL SINUS SURGERY  08/2013   Family History: family history includes Kidney disease in her mother; Lupus in her mother; Stroke in her father. Social History:  reports that she has never smoked. She has never used smokeless tobacco. She reports current alcohol use. She reports current drug use. Drug: Marijuana.     Maternal Diabetes: No Genetic Screening: Normal Maternal Ultrasounds/Referrals: Normal Fetal Ultrasounds or other Referrals:  None Maternal Substance Abuse:  No Significant Maternal Medications:  None Significant Maternal Lab Results:  Group B Strep positive and Rh negative Other Comments:  None  Review of Systems  Constitutional: Negative for fever.  Neurological: Negative for dizziness and headaches.   Maternal Medical History:  Contractions: Frequency: irregular.   Perceived severity is mild.    Fetal activity: Perceived fetal activity is normal.    Prenatal complications: HSV + at 31 weeks, on sipression, GBS +  Prenatal Complications - Diabetes: none.    Last menstrual period  11/26/2018. Maternal Exam:  Abdomen: Patient reports no abdominal tenderness. Fetal presentation: vertex  Pelvis: adequate for delivery.      Physical Exam  Constitutional: She appears well-developed.  Cardiovascular: Normal rate and regular rhythm.  Respiratory: Effort normal.  GI: Soft.  Genitourinary:    Vagina normal.   Neurological: She is alert.  Psychiatric: She has a normal mood and affect.    Prenatal labs: ABO, Rh: A/Negative/-- (04/03 0000) Antibody: Negative (04/03 0000) Rubella: Nonimmune (04/03 0000) RPR: Nonreactive (04/03 0000)  HBsAg: Negative (04/03 0000)  HIV: Non-reactive (04/03 0000)  GBS: Positive/-- (10/08 0000)  One hour GC 146  Three hour GTT wnl Essential panel and first trimester screen negative Assessment/Plan: [id number] at 28 5/7wks in active labor Admit GBS positive - ampicillin now Sars covid neg Desires epidural AROM after epidural Anticipate svd

## 2019-08-31 NOTE — H&P (Signed)
Ann Parsons is a 28 y.o. 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  Also GBS positive  Rh negative and received rhogam.  Interested in pp [place] and papers signed.   OB History    Gravida  4   Para  2   Term  2   Preterm      AB  1   Living  2     SAB      TAB  1   Ectopic      Multiple  0   Live Births  2         08-30-2011, 38 wks 1. M, 8lbs 6oz, Vaginal Delivery 04-03-2016, 39.2 wks 1. F, 8lbs, NSVD EAB x 1  Past Medical History:  Diagnosis Date  . Chlamydia   . History of GBS (group B streptococcus) UTI, currently pregnant   . Scoliosis    mild   Past Surgical History:  Procedure Laterality Date  . DILATION AND CURETTAGE OF UTERUS    . NASAL SINUS SURGERY  08/2013   Family History: family history includes Kidney disease in her mother; Lupus in her mother; Stroke in her father. Social History:  reports that she has never smoked. She has never used smokeless tobacco. She reports current alcohol use. She reports current drug use. Drug: Marijuana.     Maternal Diabetes: No Genetic Screening: Normal Maternal Ultrasounds/Referrals: Normal Fetal Ultrasounds or other Referrals:  None Maternal Substance Abuse:  No Significant Maternal Medications:  None Significant Maternal Lab Results:  Group B Strep positive and Rh negative Other Comments:  None  Review of Systems  Constitutional: Negative for fever.  Neurological: Negative for dizziness and headaches.   Maternal Medical History:  Contractions: Frequency: irregular.   Perceived severity is mild.    Fetal activity: Perceived fetal activity is normal.    Prenatal complications: HSV + at 31 weeks, on sipression, GBS +  Prenatal Complications - Diabetes: none.      Last menstrual period 11/26/2018. Maternal Exam:  Abdomen:  Patient reports no abdominal tenderness. Fetal presentation: vertex  Pelvis: adequate for delivery.      Physical Exam  Constitutional: She appears well-developed.  Cardiovascular: Normal rate and regular rhythm.  Respiratory: Effort normal.  GI: Soft.  Genitourinary:    Vagina normal.   Neurological: She is alert.  Psychiatric: She has a normal mood and affect.    Prenatal labs: ABO, Rh: A/Negative/-- (04/03 0000) Antibody: Negative (04/03 0000) Rubella: Nonimmune (04/03 0000) RPR: Nonreactive (04/03 0000)  HBsAg: Negative (04/03 0000)  HIV: Non-reactive (04/03 0000)  GBS: Positive/-- (10/08 0000)  One hour GC 146  Three hour GTT wnl Essential panel and first trimester screen negative Assessment/Plan: Pt for IOL at term with advanced dilation.  Plan slow pitocin with PCN for +GBS and AROM after 4 hours on board.  Logan Bores 08/31/2019, 8:22 PM

## 2019-09-01 ENCOUNTER — Encounter (HOSPITAL_COMMUNITY): Payer: Self-pay

## 2019-09-01 ENCOUNTER — Inpatient Hospital Stay (HOSPITAL_COMMUNITY): Payer: Medicaid Other

## 2019-09-01 ENCOUNTER — Inpatient Hospital Stay (HOSPITAL_COMMUNITY)
Admission: AD | Admit: 2019-09-01 | Payer: Medicaid Other | Source: Home / Self Care | Admitting: Obstetrics and Gynecology

## 2019-09-01 LAB — CBC
HCT: 29 % — ABNORMAL LOW (ref 36.0–46.0)
Hemoglobin: 9.4 g/dL — ABNORMAL LOW (ref 12.0–15.0)
MCH: 29.8 pg (ref 26.0–34.0)
MCHC: 32.4 g/dL (ref 30.0–36.0)
MCV: 92.1 fL (ref 80.0–100.0)
Platelets: 234 10*3/uL (ref 150–400)
RBC: 3.15 MIL/uL — ABNORMAL LOW (ref 3.87–5.11)
RDW: 12.8 % (ref 11.5–15.5)
WBC: 15.8 10*3/uL — ABNORMAL HIGH (ref 4.0–10.5)
nRBC: 0 % (ref 0.0–0.2)

## 2019-09-01 LAB — RPR: RPR Ser Ql: NONREACTIVE

## 2019-09-01 MED ORDER — ONDANSETRON HCL 4 MG/2ML IJ SOLN: 4.0000 mg | INTRAMUSCULAR | Status: DC | PRN

## 2019-09-01 MED ORDER — ACETAMINOPHEN 325 MG PO TABS
650.0000 mg | ORAL_TABLET | ORAL | Status: DC | PRN
  Administered 2019-09-01: 03:00:00 650 mg via ORAL
  Filled 2019-09-01: qty 2

## 2019-09-01 MED ORDER — WITCH HAZEL-GLYCERIN EX PADS: 1.0000 "application " | MEDICATED_PAD | CUTANEOUS | Status: DC | PRN

## 2019-09-01 MED ORDER — SIMETHICONE 80 MG PO CHEW: 80.0000 mg | CHEWABLE_TABLET | ORAL | Status: DC | PRN

## 2019-09-01 MED ORDER — DIBUCAINE (PERIANAL) 1 % EX OINT: 1.0000 "application " | TOPICAL_OINTMENT | CUTANEOUS | Status: DC | PRN

## 2019-09-01 MED ORDER — OXYCODONE HCL 5 MG PO TABS: 5.0000 mg | ORAL_TABLET | ORAL | Status: DC | PRN

## 2019-09-01 MED ORDER — DIPHENHYDRAMINE HCL 25 MG PO CAPS: 25.0000 mg | ORAL_CAPSULE | Freq: Four times a day (QID) | ORAL | Status: DC | PRN

## 2019-09-01 MED ORDER — PRENATAL MULTIVITAMIN CH
1.0000 | ORAL_TABLET | Freq: Every day | ORAL | Status: DC
  Administered 2019-09-01 – 2019-09-02 (×2): 1 via ORAL
  Filled 2019-09-01 (×2): qty 1

## 2019-09-01 MED ORDER — TETANUS-DIPHTH-ACELL PERTUSSIS 5-2.5-18.5 LF-MCG/0.5 IM SUSP: 0.5000 mL | Freq: Once | INTRAMUSCULAR | Status: DC

## 2019-09-01 MED ORDER — OXYCODONE HCL 5 MG PO TABS: 10.0000 mg | ORAL_TABLET | ORAL | Status: DC | PRN

## 2019-09-01 MED ORDER — ZOLPIDEM TARTRATE 5 MG PO TABS: 5.0000 mg | ORAL_TABLET | Freq: Every evening | ORAL | Status: DC | PRN

## 2019-09-01 MED ORDER — SENNOSIDES-DOCUSATE SODIUM 8.6-50 MG PO TABS
2.0000 | ORAL_TABLET | ORAL | Status: DC
  Administered 2019-09-02: 2 via ORAL

## 2019-09-01 MED ORDER — BENZOCAINE-MENTHOL 20-0.5 % EX AERO: 1.0000 "application " | INHALATION_SPRAY | CUTANEOUS | Status: DC | PRN

## 2019-09-01 MED ORDER — IBUPROFEN 600 MG PO TABS
600.0000 mg | ORAL_TABLET | Freq: Four times a day (QID) | ORAL | Status: DC
  Administered 2019-09-01 – 2019-09-02 (×5): 600 mg via ORAL
  Filled 2019-09-01 (×4): qty 1

## 2019-09-01 MED ORDER — COCONUT OIL OIL: 1.0000 "application " | TOPICAL_OIL | Status: DC | PRN

## 2019-09-01 MED ORDER — ONDANSETRON HCL 4 MG PO TABS: 4.0000 mg | ORAL_TABLET | ORAL | Status: DC | PRN

## 2019-09-01 NOTE — Progress Notes (Signed)
Post Partum Day 1 Subjective: no complaints, up ad lib and tolerating PO  Objective: Blood pressure (!) 134/96, pulse 83, temperature 98.6 F (37 C), temperature source Oral, resp. rate 18, height 5\' 4"  (1.626 m), weight 82 kg, last menstrual period 11/26/2018, SpO2 100 %, unknown if currently breastfeeding.  Physical Exam:  General: alert and cooperative Lochia: appropriate Uterine Fundus: firm   Recent Labs    08/31/19 2236 09/01/19 0611  HGB 10.5* 9.4*  HCT 31.8* 29.0*    Assessment/Plan: Plan for discharge tomorrow  Pt had planned for pp BTL.  D/w her option of NPO and regional anesthesia with spinal and she prefers to wait and do as interval tubal.     LOS: 1 day   Logan Bores 09/01/2019, 9:24 AM

## 2019-09-01 NOTE — Lactation Note (Signed)
This note was copied from a baby's chart. Lactation Consultation Note  Patient Name: Ann Parsons [id number] Date: 09/01/2019 Reason for consult: Initial assessment;Term P3.  Mom breastfed her previous babies without difficulty.  Newborn is 20 hours old and latching easily per mom.  Reviewed feeding cues and skin to skin.  No questions or concerns.  Breastfeeding consultation services information given and reviewed.  Maternal Data Does the patient have breastfeeding experience prior to this delivery?: Yes  Feeding Feeding Type: Breast Fed  LATCH Score                   Interventions    Lactation Tools Discussed/Used     Consult Status Consult Status: Follow-up Date: 09/02/19 Follow-up type: In-patient    Ave Filter 09/01/2019, 11:14 AM

## 2019-09-02 MED ORDER — ACETAMINOPHEN 325 MG PO TABS: 650.0000 mg | ORAL_TABLET | ORAL | 1 refills | Status: AC | PRN

## 2019-09-02 MED ORDER — IBUPROFEN 600 MG PO TABS: 600.0000 mg | ORAL_TABLET | Freq: Four times a day (QID) | ORAL | 0 refills | Status: AC

## 2019-09-02 NOTE — Progress Notes (Signed)
Pt is non Immune to rubella and declines MMR.

## 2019-09-02 NOTE — Plan of Care (Signed)
  Problem: Education: Goal: Knowledge of General Education information will improve Description: Including pain rating scale, medication(s)/side effects and non-pharmacologic comfort measures Outcome: Completed/Met   Problem: Clinical Measurements: Goal: Diagnostic test results will improve Outcome: Completed/Met Goal: Respiratory complications will improve Outcome: Completed/Met Goal: Cardiovascular complication will be avoided Outcome: Completed/Met   Problem: Activity: Goal: Risk for activity intolerance will decrease Outcome: Completed/Met   Problem: Nutrition: Goal: Adequate nutrition will be maintained Outcome: Completed/Met   Problem: Elimination: Goal: Will not experience complications related to bowel motility Outcome: Completed/Met Goal: Will not experience complications related to urinary retention Outcome: Completed/Met   Problem: Pain Managment: Goal: General experience of comfort will improve Outcome: Completed/Met   Problem: Safety: Goal: Ability to remain free from injury will improve Outcome: Completed/Met   Problem: Skin Integrity: Goal: Risk for impaired skin integrity will decrease Outcome: Completed/Met   Problem: Education: Goal: Knowledge of condition will improve Outcome: Completed/Met   Problem: Activity: Goal: Will verbalize the importance of balancing activity with adequate rest periods Outcome: Completed/Met Goal: Ability to tolerate increased activity will improve Outcome: Completed/Met   Problem: Role Relationship: Goal: Ability to demonstrate positive interaction with newborn will improve Outcome: Completed/Met

## 2019-09-02 NOTE — Progress Notes (Signed)
Post Partum Day 2 Subjective: no complaints, up ad lib and tolerating PO  Objective: Blood pressure 134/87, pulse 79, temperature 98.4 F (36.9 C), temperature source Oral, resp. rate 18, height 5\' 4"  (1.626 m), weight 82 kg, last menstrual period 11/26/2018, SpO2 100 %, unknown if currently breastfeeding.  Physical Exam:  General: alert and cooperative Lochia: appropriate Uterine Fundus: firm   Recent Labs    08/31/19 2236 09/01/19 0611  HGB 10.5* 9.4*  HCT 31.8* 29.0*    Assessment/Plan: Discharge home  Planning interval tubal   LOS: 2 days   Logan Bores 09/02/2019, 10:04 AM

## 2019-09-02 NOTE — Discharge Summary (Signed)
OB Discharge Summary     Patient Name: Ann Parsons DOB: 1991/08/19 MRN: [id number]  Date of admission: 08/31/2019 Delivering MD: Carlynn Purl [facility]   Date of discharge: 09/02/2019  Admitting diagnosis: CTX 5 min Intrauterine pregnancy: [redacted]w[redacted]d     Secondary diagnosis:  Active Problems:   Indication for care in labor or delivery   SVD (spontaneous vaginal delivery)  Additional problems: none     Discharge diagnosis: Term Pregnancy Delivered                                                                                                Post partum procedures:none   Complications: None  Hospital course:  Onset of Labor With Vaginal Delivery     28 y.o. yo [phone] at [redacted]w[redacted]d was admitted in Active Labor on 08/31/2019. Patient had an uncomplicated labor course as follows:  Membrane Rupture Time/Date: 11:35 PM ,08/31/2019   Intrapartum Procedures: Episiotomy: None [1]                                         Lacerations:  None [1]  Patient had a delivery of a Viable infant. 08/31/2019  Information for the patient's newborn:  Aluna, [place] [[id number]]  Delivery Method: Vag-Spont     Pateint had an uncomplicated postpartum course.  She is ambulating, tolerating a regular diet, passing flatus, and urinating well. Patient is discharged home in stable condition on 09/02/19.   Physical exam  Vitals:   09/01/19 1100 09/01/19 1443 09/01/19 2149 09/02/19 0452  BP: 130/84 (!) 146/95 (!) 149/90 134/87  Pulse: 78 78 83 79  Resp: 18 17 16 18   Temp: 98.7 F (37.1 C) 98.7 F (37.1 C) 98.9 F (37.2 C) 98.4 F (36.9 C)  TempSrc: Oral Oral Oral Oral  SpO2: 99% 100% 100%   Weight:      Height:       General: alert and cooperative Lochia: appropriate Uterine Fundus: firm  Labs: Lab Results  Component Value Date   WBC 15.8 (H) 09/01/2019   HGB 9.4 (L) 09/01/2019   HCT 29.0 (L) 09/01/2019   MCV 92.1 09/01/2019   PLT 234 09/01/2019   CMP Latest Ref Rng & Units 07/26/2018   Glucose 70 - 99 mg/dL 100(H)  BUN 6 - 20 mg/dL 9  Creatinine 0.44 - 1.00 mg/dL 0.91  Sodium 135 - 145 mmol/L 140  Potassium 3.5 - 5.1 mmol/L 3.7  Chloride 98 - 111 mmol/L 105  CO2 22 - 32 mmol/L 27  Calcium 8.9 - 10.3 mg/dL 8.9  Total Protein 6.5 - 8.1 g/dL 6.9  Total Bilirubin 0.3 - 1.2 mg/dL 0.6  Alkaline Phos 38 - 126 U/L 61  AST 15 - 41 U/L 22  ALT 0 - 44 U/L 18    Discharge instruction: per After Visit Summary and "Baby and Me Booklet".  After visit meds:  Allergies as of 09/02/2019   No Known Allergies     Medication List    TAKE these  medications   acetaminophen 325 MG tablet Commonly known as: Tylenol Take 2 tablets (650 mg total) by mouth every 4 (four) hours as needed (for pain scale < 4).   ibuprofen 600 MG tablet Commonly known as: ADVIL Take 1 tablet (600 mg total) by mouth every 6 (six) hours.   multivitamin with minerals Tabs tablet Take 1 tablet by mouth daily.       Diet: routine diet  Activity: Advance as tolerated. Pelvic rest for 6 weeks.   Outpatient follow up:6 weeks Follow up Appt:No future appointments. Follow up Visit:No follow-ups on file.  Postpartum contraception: Tubal Ligation planned  Newborn Data: Live born female  Birth Weight: 7 lb 11.3 oz (3496 g) APGAR: 9, 9  Newborn Delivery   Birth date/time: 08/31/2019 23:46:00 Delivery type: Vaginal, Spontaneous      Baby Feeding: Breast Disposition:home with mother   09/02/2019 Oliver Pila, MD

## 2019-09-02 NOTE — Lactation Note (Signed)
This note was copied from a baby's chart. Lactation Consultation Note  Patient Name: Ann Parsons [id number] Date: 09/02/2019 Reason for consult: Follow-up assessment  P3 mother whose infant is now 80 hours old.  Mother breast fed her first child (now 26 years old) for 3 months and her second child (now 52 years old) for one year.  Baby was asleep in the bassinet when I arrived.  Mother had no questions/concerns related to breast feeding.  Baby has been latching and feeding well and mother denies pain with latching.  She stated that baby cluster fed last night.  Mother's breasts are fuller today.  Engorgement prevention/treatment discussed.  Manual pump with instructions for use provided.  #27 flange size is more appropriate at this time.  Informed mother to continue using EBM and coconut oil for comfort to nipples and areolas.    Mother has a DEBP for home use.  She has our OP phone number for questions after discharge.     Maternal Data    Feeding Feeding Type: Breast Fed  LATCH Score                   Interventions    Lactation Tools Discussed/Used     Consult Status Consult Status: Complete Date: 09/02/19 Follow-up type: In-patient    Ezell Melikian R Kamren Heskett 09/02/2019, 10:34 AM

## 2019-09-19 ENCOUNTER — Other Ambulatory Visit: Payer: Self-pay

## 2019-09-19 ENCOUNTER — Emergency Department (HOSPITAL_COMMUNITY)
Admission: EM | Admit: 2019-09-19 | Discharge: 2019-09-19 | Disposition: A | Discharge status: Home / Self Care | Payer: Medicaid Other | Attending: Emergency Medicine | Admitting: Emergency Medicine

## 2019-09-19 DIAGNOSIS — Z79899 Other long term (current) drug therapy: Secondary | ICD-10-CM | POA: Insufficient documentation

## 2019-09-19 DIAGNOSIS — K644 Residual hemorrhoidal skin tags: Secondary | ICD-10-CM

## 2019-09-19 LAB — COMPREHENSIVE METABOLIC PANEL
ALT: 20 U/L (ref 0–44)
AST: 24 U/L (ref 15–41)
Albumin: 3.4 g/dL — ABNORMAL LOW (ref 3.5–5.0)
Alkaline Phosphatase: 100 U/L (ref 38–126)
Anion gap: 8 (ref 5–15)
BUN: 14 mg/dL (ref 6–20)
CO2: 24 mmol/L (ref 22–32)
Calcium: 8.9 mg/dL (ref 8.9–10.3)
Chloride: 105 mmol/L (ref 98–111)
Creatinine, Ser: 0.86 mg/dL (ref 0.44–1.00)
GFR calc Af Amer: >60 mL/min (ref >60)
GFR calc non Af Amer: >60 mL/min (ref >60)
Glucose, Bld: 74 mg/dL (ref 70–99)
Potassium: 3.9 mmol/L (ref 3.5–5.1)
Sodium: 137 mmol/L (ref 135–145)
Total Bilirubin: 0.6 mg/dL (ref 0.3–1.2)
Total Protein: 6.9 g/dL (ref 6.5–8.1)

## 2019-09-19 LAB — CBC
HCT: 37.9 % (ref 36.0–46.0)
Hemoglobin: 11.5 g/dL — ABNORMAL LOW (ref 12.0–15.0)
MCH: 28.6 pg (ref 26.0–34.0)
MCHC: 30.3 g/dL (ref 30.0–36.0)
MCV: 94.3 fL (ref 80.0–100.0)
Platelets: 283 10*3/uL (ref 150–400)
RBC: 4.02 MIL/uL (ref 3.87–5.11)
RDW: 13.5 % (ref 11.5–15.5)
WBC: 5.3 10*3/uL (ref 4.0–10.5)
nRBC: 0 % (ref 0.0–0.2)

## 2019-09-19 LAB — TYPE AND SCREEN
ABO/RH(D): A NEG
Antibody Screen: NEGATIVE

## 2019-09-19 LAB — I-STAT BETA HCG BLOOD, ED (MC, WL, AP ONLY): I-stat hCG, quantitative: 13 m[IU]/mL — ABNORMAL HIGH (ref <5)

## 2019-09-19 MED ORDER — HYDROCORTISONE ACETATE 25 MG RE SUPP: 25.0000 mg | Freq: Two times a day (BID) | RECTAL | 0 refills | Status: DC

## 2019-09-19 MED ORDER — HYDROCORTISONE (PERIANAL) 2.5 % EX CREA: 1.0000 "application " | TOPICAL_CREAM | Freq: Two times a day (BID) | CUTANEOUS | 0 refills | Status: DC

## 2019-09-19 MED ORDER — POLYETHYLENE GLYCOL 3350 17 G PO PACK: 17.0000 g | PACK | Freq: Every day | ORAL | 0 refills | Status: DC

## 2019-09-19 NOTE — ED Notes (Signed)
Pt is getting breast pump-- 3 weeks post partum-- check with sort nurse when calling pt for a room

## 2019-09-19 NOTE — ED Provider Notes (Signed)
[facility] Provider Note   CSN: [id number] Arrival date & time: 09/19/19  1151     History   Chief Complaint Chief Complaint  Patient presents with   Hemorrhoids    HPI Ann Parsons is a 28 y.o. female who is currently 3 weeks postpartum presenting to the ED with complaint of rectal pain.  Says she has had a hemorrhoid in the area for the past several months which has not caused her much discomfort.  Feels that the pain worsened today.  She has tried preparation H once with only mild improvement in her symptoms.  She denies any blood in her stool or complaints of constipation.  Denies any abdominal pain, vaginal complaints or fever.     HPI  Past Medical History:  Diagnosis Date   Chlamydia    History of GBS (group B streptococcus) UTI, currently pregnant    Scoliosis    mild    Patient Active Problem List   Diagnosis Date Noted   SVD (spontaneous vaginal delivery) 09/01/2019   Indication for care in labor or delivery 08/31/2019   Normal pregnancy, repeat 04/03/2016   Breakthrough bleeding with IUD 10/05/2014    Past Surgical History:  Procedure Laterality Date   DILATION AND CURETTAGE OF UTERUS     NASAL SINUS SURGERY  08/2013     OB History    Gravida  4   Para  3   Term  3   Preterm      AB  1   Living  3     SAB      TAB  1   Ectopic      Multiple  0   Live Births  3            Home Medications    Prior to Admission medications   Medication Sig Start Date End Date Taking? Authorizing Provider  acetaminophen (TYLENOL) 325 MG tablet Take 2 tablets (650 mg total) by mouth every 4 (four) hours as needed (for pain scale < 4). 09/02/19   Huel Coteichardson, Kathy, MD  hydrocortisone (ANUSOL-HC) 2.5 % rectal cream Place 1 application rectally 2 (two) times daily. 09/19/19   Teyton Pattillo, PA-C  ibuprofen (ADVIL) 600 MG tablet Take 1 tablet (600 mg total) by mouth every 6 (six) hours. 09/02/19    Huel Coteichardson, Kathy, MD  Multiple Vitamin (MULTIVITAMIN WITH MINERALS) TABS tablet Take 1 tablet by mouth daily.    [provider]  polyethylene glycol (MIRALAX / GLYCOLAX) 17 g packet Take 17 g by mouth daily. 09/19/19   Dietrich PatesKhatri, Kathrin Folden, PA-C    Family History Family History  Problem Relation Age of Onset   Kidney disease Mother    Lupus Mother    Stroke Father     Social History Social History   Tobacco Use   Smoking status: Never Smoker   Smokeless tobacco: Never Used  Substance Use Topics   Alcohol use: Yes    Comment: occasionally   Drug use: Yes    Types: Marijuana    Comment: last smoked 01/11/2019     Allergies   Patient has no known allergies.   Review of Systems Review of Systems  Constitutional: Negative for appetite change, chills and fever.  HENT: Negative for ear pain, rhinorrhea, sneezing and sore throat.   Eyes: Negative for photophobia and visual disturbance.  Respiratory: Negative for cough, chest tightness, shortness of breath and wheezing.   Cardiovascular: Negative for chest pain and  palpitations.  Gastrointestinal: Positive for rectal pain. Negative for abdominal pain, blood in stool, constipation, diarrhea, nausea and vomiting.  Genitourinary: Negative for dysuria, hematuria and urgency.  Musculoskeletal: Negative for myalgias.  Skin: Negative for rash.  Neurological: Negative for dizziness, weakness and light-headedness.     Physical Exam Updated Vital Signs BP (!) 140/110 (BP Location: Right Arm)    Pulse 95    Temp 98.4 F (36.9 C) (Oral)    Resp 16    LMP 11/26/2018 (Approximate)    SpO2 96%   Physical Exam Vitals signs and nursing note reviewed. Exam conducted with a chaperone present.  Constitutional:      General: She is not in acute distress.    Appearance: She is well-developed.  HENT:     Head: Normocephalic and atraumatic.     Nose: Nose normal.  Eyes:     General: No scleral icterus.       Left eye: No  discharge.     Conjunctiva/sclera: Conjunctivae normal.  Neck:     Musculoskeletal: Normal range of motion and neck supple.  Cardiovascular:     Rate and Rhythm: Normal rate and regular rhythm.     Heart sounds: Normal heart sounds. No murmur. No friction rub. No gallop.   Pulmonary:     Effort: Pulmonary effort is normal. No respiratory distress.     Breath sounds: Normal breath sounds.  Abdominal:     General: Bowel sounds are normal. There is no distension.     Palpations: Abdomen is soft.     Tenderness: There is no abdominal tenderness. There is no guarding.  Genitourinary:    Rectum: External hemorrhoid present.     Comments: Small external, nonthrombosed hemorrhoid noted.  No tenderness to palpation. Musculoskeletal: Normal range of motion.  Skin:    General: Skin is warm and dry.     Findings: No rash.  Neurological:     Mental Status: She is alert.     Motor: No abnormal muscle tone.     Coordination: Coordination normal.      ED Treatments / Results  Labs (all labs ordered are listed, but only abnormal results are displayed) Labs Reviewed  COMPREHENSIVE METABOLIC PANEL - Abnormal; Notable for the following components:      Result Value   Albumin 3.4 (*)    All other components within normal limits  CBC - Abnormal; Notable for the following components:   Hemoglobin 11.5 (*)    All other components within normal limits  I-STAT BETA HCG BLOOD, ED ([facility], [facility], [facility] ONLY) - Abnormal; Notable for the following components:   I-stat hCG, quantitative 13.0 (*)    All other components within normal limits  POC OCCULT BLOOD, ED  TYPE AND SCREEN    EKG None  Radiology No results found.  Procedures Procedures (including critical care time)  Medications Ordered in ED Medications - No data to display   Initial Impression / Assessment and Plan / ED Course  I have reviewed the triage vital signs and the nursing notes.  Pertinent labs & imaging results that were  available during my care of the patient were reviewed by me and considered in my medical decision making (see chart for details).        28 year old female who is currently 3 weeks postpartum and currently breast-feeding presenting to the ED with rectal pain and concern for external hemorrhoid.  She has had this hemorrhoid for the past several months but did not cause  her pain until today.  And nonthrombosed external hemorrhoid is noted on my exam with minimal tenderness to palpation.  Will treat with Anusol and MiraLAX as well as sitz bath's.  Spoke to the pharmacist who states that Anusol is safe with breast-feeding.  I will follow up with PCP, surgery and return for worsening symptoms.  Patient is hemodynamically stable, in NAD, and able to ambulate in the ED. Evaluation does not show pathology that would require ongoing emergent intervention or inpatient treatment. I explained the diagnosis to the patient. Pain has been managed and has no complaints prior to discharge. Patient is comfortable with above plan and is stable for discharge at this time. All questions were answered prior to disposition. Strict return precautions for returning to the ED were discussed. Encouraged follow up with PCP.   An After Visit Summary was printed and given to the patient.   Portions of this note were generated with Scientist, clinical (histocompatibility and immunogenetics). Dictation errors may occur despite best attempts at proofreading.   Final Clinical Impressions(s) / ED Diagnoses   Final diagnoses:  External hemorrhoids    ED Discharge Orders         Ordered    hydrocortisone (ANUSOL-HC) 25 MG suppository  2 times daily,   Status:  Discontinued     09/19/19 1605    polyethylene glycol (MIRALAX / GLYCOLAX) 17 g packet  Daily     09/19/19 1605    hydrocortisone (ANUSOL-HC) 2.5 % rectal cream  2 times daily     09/19/19 1620           Dietrich Pates, PA-C 09/19/19 1625    Charlynne Pander, MD 09/19/19 [phone]

## 2019-09-19 NOTE — ED Triage Notes (Signed)
Pt has external hemorrhoid, getting larger since one month ago when she first noticed it. Bleeding from same for 2-3 weeks.  Had a baby three weeks ago.

## 2019-09-19 NOTE — ED Notes (Signed)
Pt given graham crackers and ice per RN.

## 2019-09-19 NOTE — Discharge Instructions (Addendum)
Use the medications as directed. Return to the ED for worsening symptoms, blood in your stool, worsening pain or irritation.

## 2019-09-25 NOTE — H&P (Signed)
Ann Parsons is an 25 [id number] female here for scheduled laparoscopic bilateral tubal ligation. Pt reports has completed family and desires permanent sterilization. Pt had planned to have the procedure immediately postpartum after delivered her daughter on 08/31/19 but had an intrapartum fever so procedure was canceled. Pt has been counseled regarding alternative and reversible options.   Pertinent Gynecological History: Menses: flow is moderate Bleeding: n/a Contraception: none DES exposure: denies Blood transfusions: none Sexually transmitted diseases: no past history Previous GYN Procedures: n/a  Last mammogram: n/a Date:  Last pap: normal Date: 05/2017 OB History: G4, P3013   Menstrual History: Menarche age: 38 Patient's last menstrual period was 11/26/2018 (approximate).    Past Medical History:  Diagnosis Date  . Chlamydia   . History of GBS (group B streptococcus) UTI, currently pregnant   . Scoliosis    mild    Past Surgical History:  Procedure Laterality Date  . DILATION AND CURETTAGE OF UTERUS    . NASAL SINUS SURGERY  08/2013    Family History  Problem Relation Age of Onset  . Kidney disease Mother   . Lupus Mother   . Stroke Father     Social History:  reports that she has never smoked. She has never used smokeless tobacco. She reports current alcohol use. She reports current drug use. Drug: Marijuana.  Allergies: No Known Allergies  No medications prior to admission.    Review of Systems  Constitutional: Negative for chills and fever.  Eyes: Negative for blurred vision and double vision.  Respiratory: Negative for shortness of breath.   Cardiovascular: Negative for chest pain, palpitations, orthopnea and leg swelling.  Gastrointestinal: Negative for abdominal pain, heartburn, nausea and vomiting.  Genitourinary: Negative for dysuria.  Musculoskeletal: Negative for myalgias.  Neurological: Negative for dizziness and headaches.   Endo/Heme/Allergies: Does not bruise/bleed easily.  Psychiatric/Behavioral: Negative for depression, hallucinations, substance abuse and suicidal ideas. The patient is not nervous/anxious.     Last menstrual period 11/26/2018, unknown if currently breastfeeding. Physical Exam  Constitutional: She is oriented to person, place, and time. She appears well-developed.  Neck: Normal range of motion.  Cardiovascular: Intact distal pulses.  Respiratory: Effort normal.  GI: Soft.  Genitourinary:    Vagina normal.   Musculoskeletal: Normal range of motion.  Neurological: She is alert and oriented to person, place, and time.  Skin: Skin is warm.  Psychiatric: She has a normal mood and affect. Her behavior is normal. Judgment and thought content normal.    No results found for this or any previous visit (from the past 24 hour(s)).  No results found.  Assessment/Plan: 39TG [id number] female with complete family requesting permanent sterilization via laparoscopic bilateral tubal ligation -Risks/benefits and alternatives to procedure have been reviewed - All questions answered - To Or when ready   Isaiah Serge 09/25/2019, 9:14 PM

## 2019-10-10 ENCOUNTER — Other Ambulatory Visit: Payer: Self-pay

## 2019-10-10 ENCOUNTER — Other Ambulatory Visit (HOSPITAL_COMMUNITY)
Admission: RE | Admit: 2019-10-10 | Discharge: 2019-10-10 | Disposition: A | Discharge status: Home / Self Care | Payer: Medicaid Other | Source: Ambulatory Visit | Attending: Obstetrics and Gynecology | Admitting: Obstetrics and Gynecology

## 2019-10-10 ENCOUNTER — Encounter (HOSPITAL_BASED_OUTPATIENT_CLINIC_OR_DEPARTMENT_OTHER): Payer: Self-pay | Admitting: *Deleted

## 2019-10-10 DIAGNOSIS — Z01812 Encounter for preprocedural laboratory examination: Secondary | ICD-10-CM | POA: Insufficient documentation

## 2019-10-10 DIAGNOSIS — Z20828 Contact with and (suspected) exposure to other viral communicable diseases: Secondary | ICD-10-CM | POA: Insufficient documentation

## 2019-10-10 NOTE — Progress Notes (Signed)
Spoke w/ via phone for pre-op interview--- PT Lab needs dos----    Urine preg, T&S           Lab results------ none COVID test ------ 10-10-2019 @1110  Arrive at ------- 0600 NPO after ------ MN Medications to take morning of surgery ----- NONE Diabetic medication ----- n/a Patient Special Instructions ----- n/a Pre-Op special Istructions ----- n/a Patient verbalized understanding of instructions that were given at this phone interview. Patient denies shortness of breath, chest pain, fever, cough at this phone interview.

## 2019-10-11 LAB — NOVEL CORONAVIRUS, NAA (HOSP ORDER, SEND-OUT TO REF LAB; TAT 18-24 HRS): SARS-CoV-2, NAA: NOT DETECTED

## 2019-10-13 ENCOUNTER — Encounter (HOSPITAL_BASED_OUTPATIENT_CLINIC_OR_DEPARTMENT_OTHER)
Admission: RE | Disposition: A | Discharge status: Home / Self Care | Payer: Self-pay | Source: Home / Self Care | Attending: Obstetrics and Gynecology

## 2019-10-13 ENCOUNTER — Encounter (HOSPITAL_BASED_OUTPATIENT_CLINIC_OR_DEPARTMENT_OTHER): Payer: Self-pay | Admitting: Obstetrics and Gynecology

## 2019-10-13 ENCOUNTER — Ambulatory Visit (HOSPITAL_BASED_OUTPATIENT_CLINIC_OR_DEPARTMENT_OTHER): Payer: Medicaid Other | Admitting: Anesthesiology

## 2019-10-13 ENCOUNTER — Ambulatory Visit (HOSPITAL_BASED_OUTPATIENT_CLINIC_OR_DEPARTMENT_OTHER)
Admission: RE | Admit: 2019-10-13 | Discharge: 2019-10-13 | Disposition: A | Discharge status: Home / Self Care | Payer: Medicaid Other | Attending: Obstetrics and Gynecology | Admitting: Obstetrics and Gynecology

## 2019-10-13 DIAGNOSIS — I10 Essential (primary) hypertension: Secondary | ICD-10-CM | POA: Insufficient documentation

## 2019-10-13 DIAGNOSIS — M419 Scoliosis, unspecified: Secondary | ICD-10-CM | POA: Insufficient documentation

## 2019-10-13 DIAGNOSIS — Z9079 Acquired absence of other genital organ(s): Secondary | ICD-10-CM

## 2019-10-13 DIAGNOSIS — Z302 Encounter for sterilization: Secondary | ICD-10-CM | POA: Diagnosis present

## 2019-10-13 HISTORY — DX: Personal history of other infectious and parasitic diseases: Z86.19

## 2019-10-13 HISTORY — PX: LAPAROSCOPIC BILATERAL SALPINGECTOMY: SHX5889

## 2019-10-13 HISTORY — DX: Elevated blood-pressure reading, without diagnosis of hypertension: R03.0

## 2019-10-13 HISTORY — DX: Acquired absence of other genital organ(s): Z90.79

## 2019-10-13 LAB — POCT PREGNANCY, URINE: Preg Test, Ur: NEGATIVE

## 2019-10-13 LAB — TYPE AND SCREEN
ABO/RH(D): A NEG
Antibody Screen: NEGATIVE

## 2019-10-13 LAB — ABO/RH: ABO/RH(D): A NEG

## 2019-10-13 SURGERY — SALPINGECTOMY, BILATERAL, LAPAROSCOPIC
Anesthesia: General | Site: Abdomen | Laterality: Bilateral

## 2019-10-13 MED ORDER — ONDANSETRON HCL 4 MG/2ML IJ SOLN
INTRAMUSCULAR | Status: AC
  Filled 2019-10-13: qty 2

## 2019-10-13 MED ORDER — LACTATED RINGERS IV SOLN
INTRAVENOUS | Status: DC
  Filled 2019-10-13: qty 1000

## 2019-10-13 MED ORDER — KETOROLAC TROMETHAMINE 30 MG/ML IJ SOLN
INTRAMUSCULAR | Status: DC | PRN
  Administered 2019-10-13: 30 mg via INTRAVENOUS

## 2019-10-13 MED ORDER — PROPOFOL 10 MG/ML IV BOLUS
INTRAVENOUS | Status: DC | PRN
  Administered 2019-10-13: 200 mg via INTRAVENOUS

## 2019-10-13 MED ORDER — FENTANYL CITRATE (PF) 100 MCG/2ML IJ SOLN
INTRAMUSCULAR | Status: DC | PRN
  Administered 2019-10-13: 50 ug via INTRAVENOUS
  Administered 2019-10-13: 100 ug via INTRAVENOUS
  Administered 2019-10-13: 50 ug via INTRAVENOUS

## 2019-10-13 MED ORDER — LACTATED RINGERS IV SOLN
INTRAVENOUS | Status: DC
  Administered 2019-10-13 (×3): via INTRAVENOUS
  Filled 2019-10-13: qty 1000

## 2019-10-13 MED ORDER — ROCURONIUM BROMIDE 100 MG/10ML IV SOLN
INTRAVENOUS | Status: DC | PRN
  Administered 2019-10-13: 60 mg via INTRAVENOUS

## 2019-10-13 MED ORDER — LIDOCAINE HCL (CARDIAC) PF 100 MG/5ML IV SOSY
PREFILLED_SYRINGE | INTRAVENOUS | Status: DC | PRN
  Administered 2019-10-13: 100 mg via INTRAVENOUS

## 2019-10-13 MED ORDER — DEXAMETHASONE SODIUM PHOSPHATE 4 MG/ML IJ SOLN
INTRAMUSCULAR | Status: DC | PRN
  Administered 2019-10-13: 10 mg via INTRAVENOUS

## 2019-10-13 MED ORDER — MENTHOL 3 MG MT LOZG
LOZENGE | OROMUCOSAL | Status: AC
  Filled 2019-10-13: qty 9

## 2019-10-13 MED ORDER — OXYCODONE-ACETAMINOPHEN 2.5-325 MG PO TABS: 1.0000 | ORAL_TABLET | ORAL | 0 refills | Status: AC | PRN

## 2019-10-13 MED ORDER — FENTANYL CITRATE (PF) 100 MCG/2ML IJ SOLN
INTRAMUSCULAR | Status: AC
  Filled 2019-10-13: qty 2

## 2019-10-13 MED ORDER — ACETAMINOPHEN 500 MG PO TABS
ORAL_TABLET | ORAL | Status: AC
  Filled 2019-10-13: qty 2

## 2019-10-13 MED ORDER — ROCURONIUM BROMIDE 10 MG/ML (PF) SYRINGE
PREFILLED_SYRINGE | INTRAVENOUS | Status: AC
  Filled 2019-10-13: qty 10

## 2019-10-13 MED ORDER — KETOROLAC TROMETHAMINE 30 MG/ML IJ SOLN
INTRAMUSCULAR | Status: AC
  Filled 2019-10-13: qty 1

## 2019-10-13 MED ORDER — MIDAZOLAM HCL 2 MG/2ML IJ SOLN
INTRAMUSCULAR | Status: AC
  Filled 2019-10-13: qty 2

## 2019-10-13 MED ORDER — SUGAMMADEX SODIUM 200 MG/2ML IV SOLN
INTRAVENOUS | Status: DC | PRN
  Administered 2019-10-13: 200 mg via INTRAVENOUS

## 2019-10-13 MED ORDER — MIDAZOLAM HCL 5 MG/5ML IJ SOLN
INTRAMUSCULAR | Status: DC | PRN
  Administered 2019-10-13: 2 mg via INTRAVENOUS

## 2019-10-13 MED ORDER — BUPIVACAINE HCL (PF) 0.25 % IJ SOLN
INTRAMUSCULAR | Status: AC
  Filled 2019-10-13: qty 30

## 2019-10-13 MED ORDER — PROPOFOL 10 MG/ML IV BOLUS
INTRAVENOUS | Status: AC
  Filled 2019-10-13: qty 40

## 2019-10-13 MED ORDER — ONDANSETRON HCL 4 MG/2ML IJ SOLN
INTRAMUSCULAR | Status: DC | PRN
  Administered 2019-10-13: 4 mg via INTRAVENOUS

## 2019-10-13 MED ORDER — LIDOCAINE 2% (20 MG/ML) 5 ML SYRINGE
INTRAMUSCULAR | Status: AC
  Filled 2019-10-13: qty 5

## 2019-10-13 MED ORDER — BUPIVACAINE HCL (PF) 0.25 % IJ SOLN
INTRAMUSCULAR | Status: DC | PRN
  Administered 2019-10-13: 20 mL
  Administered 2019-10-13: 10 mL

## 2019-10-13 MED ORDER — ACETAMINOPHEN 500 MG PO TABS
1000.0000 mg | ORAL_TABLET | ORAL | Status: AC
  Administered 2019-10-13: 1000 mg via ORAL
  Filled 2019-10-13: qty 2

## 2019-10-13 MED ORDER — DEXAMETHASONE SODIUM PHOSPHATE 10 MG/ML IJ SOLN
INTRAMUSCULAR | Status: AC
  Filled 2019-10-13: qty 1

## 2019-10-13 SURGICAL SUPPLY — 35 items
ADH SKN CLS APL DERMABOND .7 (GAUZE/BANDAGES/DRESSINGS) ×1
BAG SPEC RTRVL LRG 6X4 10 (ENDOMECHANICALS)
CABLE HIGH FREQUENCY MONO STRZ (ELECTRODE) IMPLANT
CATH ROBINSON RED A/P 16FR (CATHETERS) ×2 IMPLANT
COVER MAYO STAND STRL (DRAPES) ×3 IMPLANT
COVER WAND RF STERILE (DRAPES) ×3 IMPLANT
DECANTER SPIKE VIAL GLASS SM (MISCELLANEOUS) ×3 IMPLANT
DERMABOND ADVANCED (GAUZE/BANDAGES/DRESSINGS) ×2
DERMABOND ADVANCED .7 DNX12 (GAUZE/BANDAGES/DRESSINGS) ×1 IMPLANT
DRSG OPSITE POSTOP 3X4 (GAUZE/BANDAGES/DRESSINGS) IMPLANT
DURAPREP 26ML APPLICATOR (WOUND CARE) ×3 IMPLANT
GAUZE 4X4 16PLY RFD (DISPOSABLE) ×3 IMPLANT
GLOVE BIO SURGEON STRL SZ 6.5 (GLOVE) ×4 IMPLANT
GLOVE BIO SURGEONS STRL SZ 6.5 (GLOVE) ×2
GOWN STRL REUS W/TWL LRG LVL3 (GOWN DISPOSABLE) ×9 IMPLANT
MANIPULATOR UTERINE 7CM CLEARV (MISCELLANEOUS) ×3 IMPLANT
NS IRRIG 1000ML POUR BTL (IV SOLUTION) ×3 IMPLANT
PACK LAPAROSCOPY BASIN (CUSTOM PROCEDURE TRAY) ×3 IMPLANT
PACK TRENDGUARD 450 HYBRID PRO (MISCELLANEOUS) ×1 IMPLANT
POUCH SPECIMEN RETRIEVAL 10MM (ENDOMECHANICALS) IMPLANT
PROTECTOR NERVE ULNAR (MISCELLANEOUS) ×6 IMPLANT
SET IRRIG TUBING LAPAROSCOPIC (IRRIGATION / IRRIGATOR) IMPLANT
SET TUBE SMOKE EVAC HIGH FLOW (TUBING) ×1 IMPLANT
SHEARS HARMONIC ACE PLUS 36CM (ENDOMECHANICALS) ×2 IMPLANT
SLEEVE XCEL OPT CAN 5 100 (ENDOMECHANICALS) ×3 IMPLANT
SOLUTION ELECTROLUBE (MISCELLANEOUS) IMPLANT
SUT VIC AB 3-0 PS2 18 (SUTURE) ×3
SUT VIC AB 3-0 PS2 18XBRD (SUTURE) ×1 IMPLANT
SUT VICRYL 0 UR6 27IN ABS (SUTURE) IMPLANT
SYSTEM CARTER THOMASON II (TROCAR) IMPLANT
TOWEL OR 17X26 10 PK STRL BLUE (TOWEL DISPOSABLE) ×6 IMPLANT
TRENDGUARD 450 HYBRID PRO PACK (MISCELLANEOUS) ×3
TROCAR XCEL NON-BLD 11X100MML (ENDOMECHANICALS) IMPLANT
TROCAR XCEL NON-BLD 5MMX100MML (ENDOMECHANICALS) ×6 IMPLANT
WARMER LAPAROSCOPE (MISCELLANEOUS) ×3 IMPLANT

## 2019-10-13 NOTE — Anesthesia Preprocedure Evaluation (Signed)
Anesthesia Evaluation  Patient identified by MRN, date of birth, ID band Patient awake    Reviewed: Allergy & Precautions, NPO status , Patient's Chart, lab work & pertinent test results  Airway Mallampati: II  TM Distance: >3 FB Neck ROM: Full    Dental no notable dental hx.    Pulmonary neg pulmonary ROS,    Pulmonary exam normal breath sounds clear to auscultation       Cardiovascular hypertension, Normal cardiovascular exam Rhythm:Regular Rate:Normal     Neuro/Psych negative neurological ROS  negative psych ROS   GI/Hepatic negative GI ROS, Neg liver ROS,   Endo/Other  negative endocrine ROS  Renal/GU negative Renal ROS  negative genitourinary   Musculoskeletal negative musculoskeletal ROS (+)   Abdominal   Peds negative pediatric ROS (+)  Hematology negative hematology ROS (+)   Anesthesia Other Findings   Reproductive/Obstetrics negative OB ROS                             Anesthesia Physical Anesthesia Plan  ASA: II  Anesthesia Plan: General   Post-op Pain Management:    Induction: Intravenous  PONV Risk Score and Plan: 3 and Ondansetron, Dexamethasone, Midazolam and Treatment may vary due to age or medical condition  Airway Management Planned: Oral ETT  Additional Equipment:   Intra-op Plan:   Post-operative Plan: Extubation in OR  Informed Consent: I have reviewed the patients History and Physical, chart, labs and discussed the procedure including the risks, benefits and alternatives for the proposed anesthesia with the patient or authorized representative who has indicated his/her understanding and acceptance.     Dental advisory given  Plan Discussed with: CRNA  Anesthesia Plan Comments:        Anesthesia Quick Evaluation

## 2019-10-13 NOTE — Discharge Instructions (Signed)
Call office with any concerns [phone] ? ? ?Post Anesthesia Home Care Instructions ? ?Activity: ?Get plenty of rest for the remainder of the day. A responsible individual must stay with you for 24 hours following the procedure.  ?For the next 24 hours, DO NOT: ?-Drive a car ?-Operate machinery ?-Drink alcoholic beverages ?-Take any medication unless instructed by your physician ?-Make any legal decisions or sign important papers. ? ?Meals: ?Start with liquid foods such as gelatin or soup. Progress to regular foods as tolerated. Avoid greasy, spicy, heavy foods. If nausea and/or vomiting occur, drink only clear liquids until the nausea and/or vomiting subsides. Call your physician if vomiting continues. ? ?Special Instructions/Symptoms: ?Your throat may feel dry or sore from the anesthesia or the breathing tube placed in your throat during surgery. If this causes discomfort, gargle with warm salt water. The discomfort should disappear within 24 hours. ? ?If you had a scopolamine patch placed behind your ear for the management of post- operative nausea and/or vomiting: ? ?1. The medication in the patch is effective for 72 hours, after which it should be removed.  Wrap patch in a tissue and discard in the trash. Wash hands thoroughly with soap and water. ?2. You may remove the patch earlier than 72 hours if you experience unpleasant side effects which may include dry mouth, dizziness or visual disturbances. ?3. Avoid touching the patch. Wash your hands with soap and water after contact with the patch. ?    ?

## 2019-10-13 NOTE — Op Note (Signed)
Operative Note    Preoperative Diagnosis: Complete family status/requests permanent sterilization   Postoperative Diagnosis: same   Procedure: Laparoscopic bilateral tubal salpingectomy   Surgeon: Mickle Mallory DO Assist: Eula Flax MD  Anesthesia: General  Fluids: LR 1L EBL: 55ml UOP: 70ml   Findings: Grossly normal uterus, tubes and ovaries   Specimen: Bilateral fallopian tube segments   Procedure Note  Patient was taken to the operating room where general anesthesia was administered without difficulty. She was then prepped and draped in the normal sterile fashion in the dorsal lithotomy position. An appropriate timeout was performed. A speculum was then placed within the vagina and a Clearvue uterine manipulator placed. The bladder was emptied. Attention was then turned to the patient's abdomen after draping where the infraumbilicus was incised and a 57mm scope placed under direct visualization. Gas flow was then applied and a pneumoperitoneum obtained with approximate 3 L of CO2 gas. With patient in Trendelenburg the uterus and tubes and ovaries were inspected with normal findings.  Two more 81mm scopes were placed under direct visualization in the left and right quadrants respectively under direct visualization. Both fallopian tubes were easily identified and followed to their fimbriated ends. No gross abnormalities noted.  A harmonic cautery was then used to excise the fallopian tubes till about 2cm from the uterine cornua. Great hemostasis was noted. The remainder of the pelvis and abdomen were inspected and found to be normal. The patient was then flattened. All instruments were removed from the abdomen and the pneumoperitoneum reduced through the operative trocar. The trocar was finally removed. The incisions were  closed with 4-0 Vicryl and dermabond. The clearvue manipulator  was removed- hemostasis noted at single toothed tenaculum site. Patient was then awakened and taken to  the recovery room in good condition. Counts were correct per nursing x 2

## 2019-10-13 NOTE — Anesthesia Procedure Notes (Signed)
Procedure Name: Intubation Date/Time: 10/13/2019 8:14 AM Performed by: Justice Rocher, CRNA Pre-anesthesia Checklist: Patient identified, Emergency Drugs available, Suction available and Patient being monitored Patient Re-evaluated:Patient Re-evaluated prior to induction Oxygen Delivery Method: Circle system utilized Preoxygenation: Pre-oxygenation with 100% oxygen Induction Type: IV induction Ventilation: Mask ventilation without difficulty Laryngoscope Size: Mac and 3 Grade View: Grade II Tube type: Oral Tube size: 7.0 mm Number of attempts: 1 Airway Equipment and Method: Stylet and Oral airway Placement Confirmation: ETT inserted through vocal cords under direct vision,  positive ETCO2 and breath sounds checked- equal and bilateral Secured at: 23 cm Tube secured with: Tape Dental Injury: Teeth and Oropharynx as per pre-operative assessment

## 2019-10-13 NOTE — Anesthesia Postprocedure Evaluation (Signed)
Anesthesia Post Note  Patient: Ann Parsons  Procedure(s) Performed: LAPAROSCOPIC BILATERAL SALPINGECTOMY (Bilateral Abdomen)     Patient location during evaluation: PACU Anesthesia Type: General Level of consciousness: awake and alert Pain management: pain level controlled Vital Signs Assessment: post-procedure vital signs reviewed and stable Respiratory status: spontaneous breathing, nonlabored ventilation, respiratory function stable and patient connected to nasal cannula oxygen Cardiovascular status: blood pressure returned to baseline and stable Postop Assessment: no apparent nausea or vomiting Anesthetic complications: no    Last Vitals:  Vitals:   10/13/19 1000 10/13/19 1115  BP: 125/85 136/90  Pulse: (!) 56 66  Resp: 12 15  Temp:  36.8 C  SpO2: 100% 98%    Last Pain:  Vitals:   10/13/19 1115  TempSrc:   PainSc: 0-No pain                 Montez Hageman

## 2019-10-13 NOTE — Interval H&P Note (Signed)
History and Physical Interval Note:  Pt seen. Reviewed procedure once again. No questions Verified her consent to procedure To OR when ready  10/13/2019 7:55 AM  Ann Parsons  has presented today for surgery, with the diagnosis of sterilization.  The various methods of treatment have been discussed with the patient and family. After consideration of risks, benefits and other options for treatment, the patient has consented to  Procedure(s): LAPAROSCOPIC BILATERAL SALPINGECTOMY (Bilateral) as a surgical intervention.  The patient's history has been reviewed, patient examined, no change in status, stable for surgery.  I have reviewed the patient's chart and labs.  Questions were answered to the patient's satisfaction.     Isaiah Serge

## 2019-10-13 NOTE — Transfer of Care (Signed)
Immediate Anesthesia Transfer of Care Note  Patient: Ann Parsons  Procedure(s) Performed: Procedure(s) (LRB): LAPAROSCOPIC BILATERAL SALPINGECTOMY (Bilateral)  Patient Location: PACU  Anesthesia Type: General  Level of Consciousness: awake, sedated, patient cooperative and responds to stimulation  Airway & Oxygen Therapy: Patient Spontanous Breathing and Patient connected to NC02 and soft FM   Post-op Assessment: Report given to PACU RN, Post -op Vital signs reviewed and stable and Patient moving all extremities  Post vital signs: Reviewed and stable  Complications: No apparent anesthesia complications

## 2019-10-16 LAB — SURGICAL PATHOLOGY

## 2019-10-20 ENCOUNTER — Ambulatory Visit: Payer: Medicaid Other | Admitting: Family Medicine

## 2019-10-23 ENCOUNTER — Other Ambulatory Visit (HOSPITAL_COMMUNITY)
Admission: RE | Admit: 2019-10-23 | Discharge: 2019-10-23 | Disposition: A | Discharge status: Home / Self Care | Payer: Medicaid Other | Source: Ambulatory Visit | Attending: Family Medicine | Admitting: Family Medicine

## 2019-10-23 ENCOUNTER — Ambulatory Visit (INDEPENDENT_AMBULATORY_CARE_PROVIDER_SITE_OTHER): Payer: Medicaid Other | Admitting: Family Medicine

## 2019-10-23 ENCOUNTER — Encounter: Payer: Self-pay | Admitting: Family Medicine

## 2019-10-23 ENCOUNTER — Other Ambulatory Visit: Payer: Self-pay

## 2019-10-23 VITALS — BP 118/66 | HR 77 | Ht 64.0 in | Wt 163.2 lb

## 2019-10-23 DIAGNOSIS — Z124 Encounter for screening for malignant neoplasm of cervix: Secondary | ICD-10-CM | POA: Insufficient documentation

## 2019-10-23 DIAGNOSIS — Z7689 Persons encountering health services in other specified circumstances: Secondary | ICD-10-CM

## 2019-10-23 NOTE — Patient Instructions (Signed)
It was nice to see you again today,  Your blood pressure was good again.  I do not think we need to continue to monitor this until your next visit.  I will call you when I get the results of your Pap smear.  I do not need to see you until your next visit year from now, but if something comes up between now and then and you wish to discuss it with [place] feel free to make an appointment with our office.  Have a great day,  Clemetine Marker, MD

## 2019-10-23 NOTE — Progress Notes (Signed)
   [facility] Phone: [phone]     Ann Parsons - 28 y.o. female MRN [id number]  Date of birth: Dec 12, 1990  Subjective:   cc: establish care, high blood pressure  HPI:  Patient is here to establish care.  Recently transferred care of her children to [place].  Patient recently had another child and subsequently got a bilateral tubal ligation earlier this month.  States she is still having some abdominal discomfort with this.  She states that during her delivery her blood pressure was elevated, and remained elevated for "a few weeks "after, but with subsequently normal on her postpartum visit and since that time.  She has no history of high blood pressure.  PMH: Patient is not aware of any significant past medical history, other than scoliosis as an adolescent.  Has not been to a chiropractor in several years for this, but still has some residual back pain.  PSH: Bilateral tubal ligation earlier this month.  D&C in 2010  Family history: Kidney disease and lupus in her mother.  CVA in father.  Hypertension breast cancer in maternal grandmother.  Medications: Currently taking a multivitamin and ibuprofen as needed for BTL pain  Allergies: NKDA  Social: Patient lives with her 3 children and fianc.  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  No tobacco use, no drug use, no alcohol use.  ROS: Denies headache, chest pain, N/V/D/C, dysuria, cough, S OB, dizziness.   Objective:   BP 118/66   Pulse 77   Ht 5\' 4"  (1.626 m)   Wt 163 lb 3.2 oz (74 kg)   LMP 11/26/2018 (Approximate)   SpO2 99%   BMI 28.01 kg/m  Gen: Alert and oriented.  No acute distress.  Normal-appearing African-American female.  Appears stated age. HEENT: NCAT, MMM, PERRLA, EOMI. Neck: No thyromegaly, no cervical lymphadenopathy CV: Regular rhythm, normal rate.  No murmurs.  2+ radial pulse bilaterally. Resp: L CTA B.  No wheezing, no crackles.  No tachypnea. GI: Soft, nontender.  Normal bowel  sounds.. GU: Normal-appearing female genitalia with no lesions, no bleeding, no discharge. Msk: 5/5 strength bilaterally upper and lower extremities.  No edema. Neuro: Sensation intact.  Cranial nerves II through XII grossly intact. Skin: No rashes.  Skin warm and dry. Psych: Good eye contact.  Spontaneous speech.  Normal tone and rate of speech.  Pleasant affect.  Assessment/Plan:   Encounter to establish care Patient is well-appearing 28 year old female with no significant past medical history.  Desires to establish care with [place] because her children go to our clinic.  Recent BTL, with no complications.  Stable home and work environment.  Patient has no concerns. -Follow-up in 1 year  Cervical cancer screening Patient cannot member the last time she had.  States she did not have it on her recent pregnancy.  Performed Pap smear today.  Nothing unusual on physical exam. -Follow-up with results.     Clemetine Marker, MD PGY-2 [facility]

## 2019-10-25 DIAGNOSIS — Z124 Encounter for screening for malignant neoplasm of cervix: Secondary | ICD-10-CM

## 2019-10-25 DIAGNOSIS — Z7689 Persons encountering health services in other specified circumstances: Secondary | ICD-10-CM

## 2019-10-25 HISTORY — DX: Encounter for screening for malignant neoplasm of cervix: Z12.4

## 2019-10-25 HISTORY — DX: Persons encountering health services in other specified circumstances: Z76.89

## 2019-10-25 LAB — CYTOLOGY - PAP
Diagnosis: NEGATIVE
Diagnosis: REACTIVE

## 2019-10-25 NOTE — Assessment & Plan Note (Signed)
Patient is well-appearing 28 year old female with no significant past medical history.  Desires to establish care with [place] because her children go to our clinic.  Recent BTL, with no complications.  Stable home and work environment.  Patient has no concerns. -Follow-up in 1 year

## 2019-10-25 NOTE — Assessment & Plan Note (Signed)
Patient cannot member the last time she had.  States she did not have it on her recent pregnancy.  Performed Pap smear today.  Nothing unusual on physical exam. -Follow-up with results.

## 2019-10-30 ENCOUNTER — Encounter: Payer: Self-pay | Admitting: Family Medicine

## 2019-10-30 NOTE — Progress Notes (Signed)
Sent pt letter informing her of normal results of pap.

## 2020-02-21 IMAGING — DX DG HAND COMPLETE 3+V*R*
3 series · 3 of 3 positions shown · non-contrast
Comparison: No prior.

CLINICAL DATA: History of third finger injury with would 3 days
ago. Swelling and redness. Pus noted.

EXAM:
RIGHT HAND - COMPLETE 3+ VIEW

[hand ap]
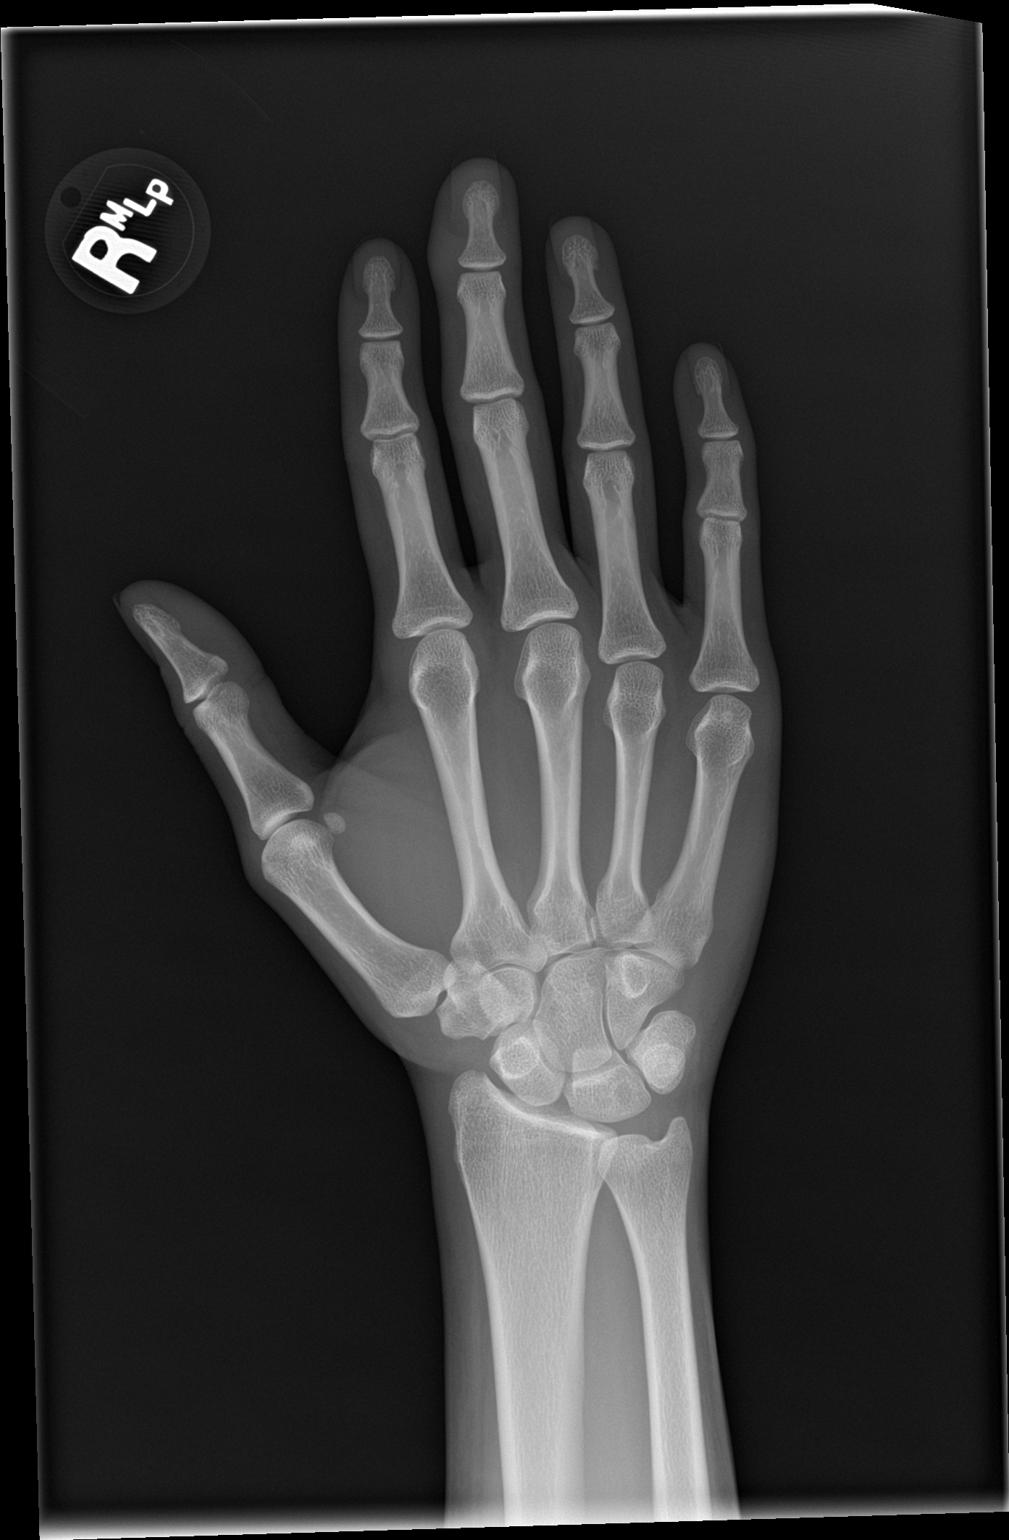

[hand obl]
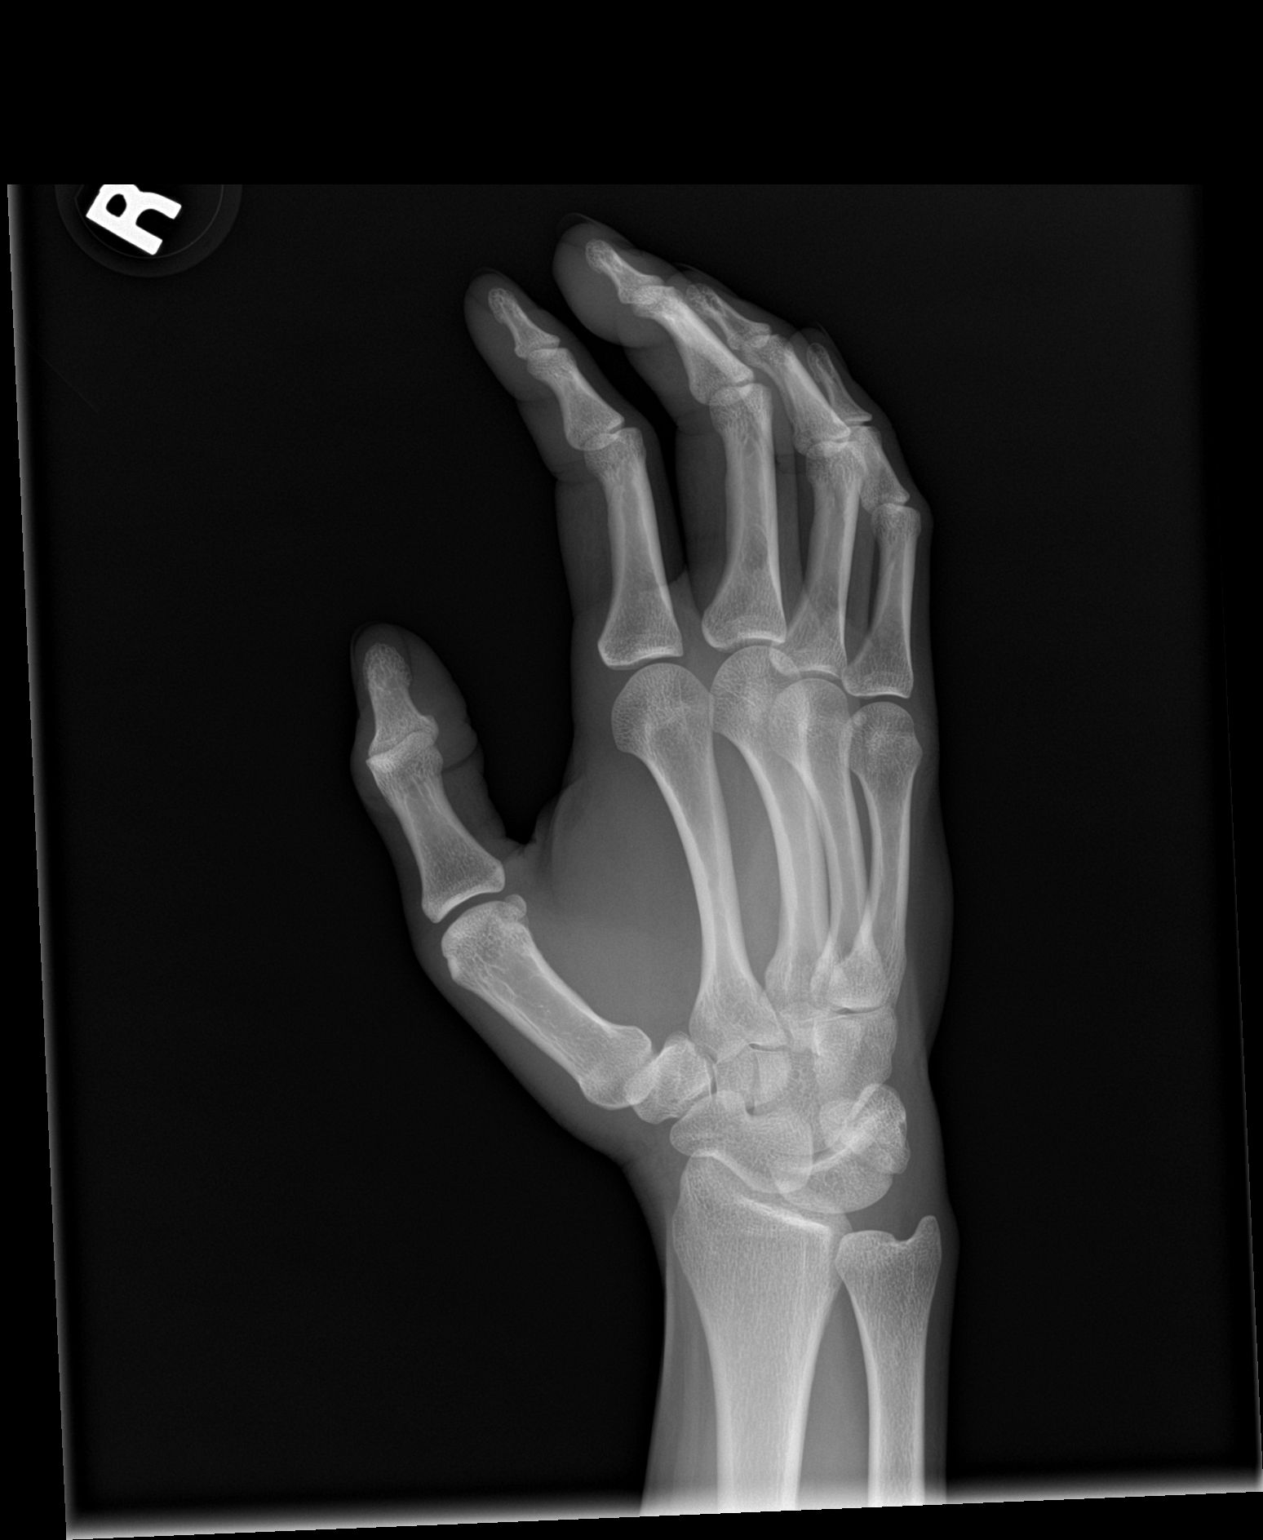

[hand lat]
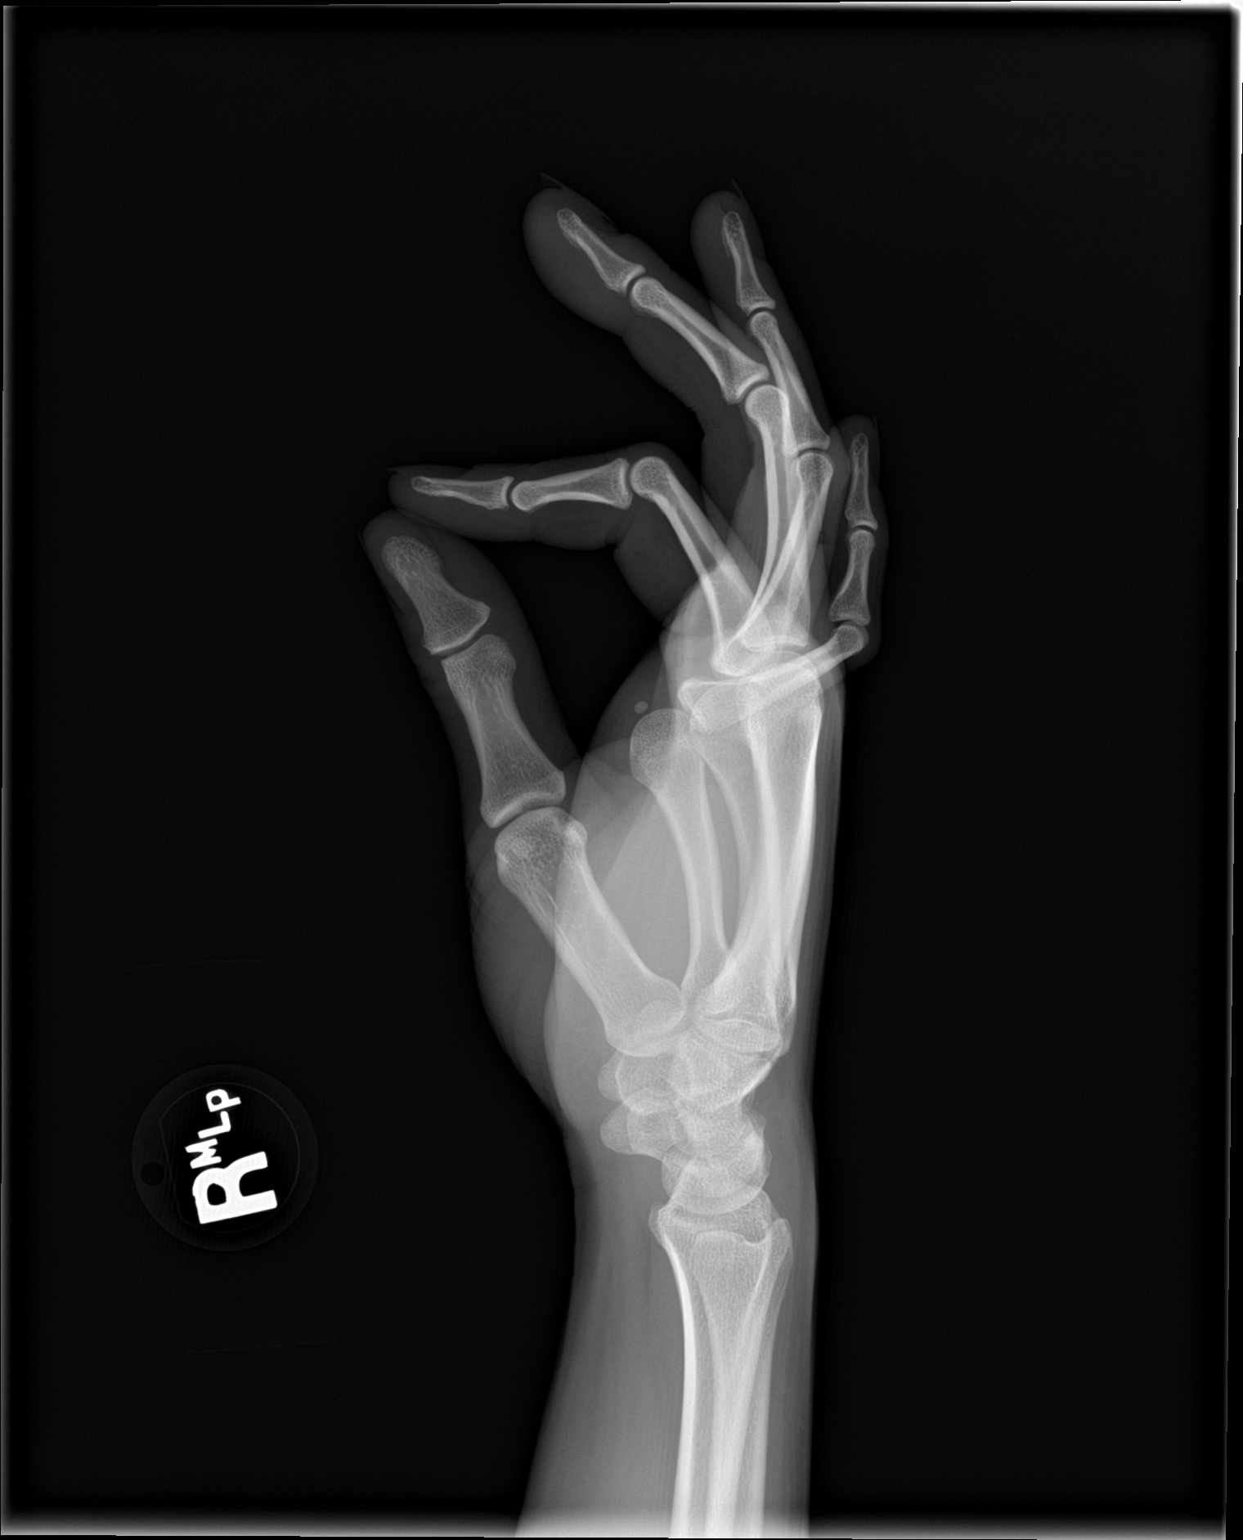

[3 of 3 positions shown; findings below may reference images not displayed]

FINDINGS: Diffuse soft tissue swelling noted of the distal aspect of the right
third digit. No radiopaque foreign body noted. No evidence of
fracture or dislocation. No focal bony erosions noted.
IMPRESSION: Soft tissue swelling noted the distal aspect of the right third
digit. No radiopaque foreign body noted. No acute bony abnormality.
No bony erosive changes are present.

## 2020-08-09 ENCOUNTER — Emergency Department (HOSPITAL_COMMUNITY)
Admission: EM | Admit: 2020-08-09 | Discharge: 2020-08-09 | Disposition: A | Discharge status: Home / Self Care | Payer: Medicaid Other | Attending: Emergency Medicine | Admitting: Emergency Medicine

## 2020-08-09 ENCOUNTER — Encounter (HOSPITAL_COMMUNITY): Payer: Self-pay

## 2020-08-09 ENCOUNTER — Other Ambulatory Visit: Payer: Self-pay

## 2020-08-09 DIAGNOSIS — R591 Generalized enlarged lymph nodes: Secondary | ICD-10-CM | POA: Diagnosis present

## 2020-08-09 DIAGNOSIS — R5383 Other fatigue: Secondary | ICD-10-CM | POA: Insufficient documentation

## 2020-08-09 DIAGNOSIS — Z87891 Personal history of nicotine dependence: Secondary | ICD-10-CM | POA: Diagnosis not present

## 2020-08-09 LAB — CBC WITH DIFFERENTIAL/PLATELET
Abs Immature Granulocytes: 0.02 10*3/uL (ref 0.00–0.07)
Basophils Absolute: 0.1 10*3/uL (ref 0.0–0.1)
Basophils Relative: 1 %
Eosinophils Absolute: 0.3 10*3/uL (ref 0.0–0.5)
Eosinophils Relative: 4 %
HCT: 38.1 % (ref 36.0–46.0)
Hemoglobin: 13 g/dL (ref 12.0–15.0)
Immature Granulocytes: 0 %
Lymphocytes Relative: 34 %
Lymphs Abs: 2.4 10*3/uL (ref 0.7–4.0)
MCH: 32.4 pg (ref 26.0–34.0)
MCHC: 34.1 g/dL (ref 30.0–36.0)
MCV: 95 fL (ref 80.0–100.0)
Monocytes Absolute: 0.4 10*3/uL (ref 0.1–1.0)
Monocytes Relative: 6 %
Neutro Abs: 3.9 10*3/uL (ref 1.7–7.7)
Neutrophils Relative %: 55 %
Platelets: 215 10*3/uL (ref 150–400)
RBC: 4.01 MIL/uL (ref 3.87–5.11)
RDW: 12 % (ref 11.5–15.5)
WBC: 7 10*3/uL (ref 4.0–10.5)
nRBC: 0 % (ref 0.0–0.2)

## 2020-08-09 NOTE — ED Triage Notes (Signed)
Pt reports 3 cysts popping up on head in last 2 days.

## 2020-08-09 NOTE — ED Provider Notes (Signed)
[facility]-EMERGENCY DEPT Provider Note   CSN: [id number] Arrival date & time: 08/09/20  2041     History Chief Complaint  Patient presents with  . Cyst    Ann Parsons is a 29 y.o. female.  Patient is a 29 year old female who presents with some bumps on her scalp.  She noticed them about 2 days ago.  They are around her ears.  She denies any ear pain.  No sores on her head.  No sore throat or nasal congestion.  No known fevers.  She feels a little bit tired but does not otherwise feel bad.  No abdominal pain.  No other lymph node swelling that she has noticed.  No history of similar symptoms in the past.        Past Medical History:  Diagnosis Date  . Elevated blood pressure, situational    10-10-2019 per pt blood pressure elevated since SVD 08-31-2019,  pt states she checks bp daily , systolic average 564P/ 90s diastolic, no meds  . History of chlamydia   . History of group B Streptococcus (GBS) infection    SVD 08-31-2019  . Scoliosis    mild  . Status post bilateral salpingectomy 10/13/2019    Patient Active Problem List   Diagnosis Date Noted  . Encounter to establish care 10/25/2019  . Cervical cancer screening 10/25/2019  . Status post bilateral salpingectomy 10/13/2019  . SVD (spontaneous vaginal delivery) 09/01/2019  . Indication for care in labor or delivery 08/31/2019  . Normal pregnancy, repeat 04/03/2016  . Breakthrough bleeding with IUD 10/05/2014    Past Surgical History:  Procedure Laterality Date  . DILATION AND CURETTAGE OF UTERUS  2010  . LAPAROSCOPIC BILATERAL SALPINGECTOMY Bilateral 10/13/2019   Procedure: LAPAROSCOPIC BILATERAL SALPINGECTOMY;  Surgeon: Edwinna Areola, DO;  Location: [facility];  Service: Gynecology;  Laterality: Bilateral;  . NASAL SINUS SURGERY  08/2013     OB History    Gravida  4   Para  3   Term  3   Preterm      AB  1   Living  3     SAB      TAB  1    Ectopic      Multiple  0   Live Births  3           Family History  Problem Relation Age of Onset  . Kidney disease Mother   . Lupus Mother   . Stroke Father   . Hypertension Maternal Grandmother   . Breast cancer Maternal Grandmother 103    Social History   Tobacco Use  . Smoking status: Never Smoker  . Smokeless tobacco: Never Used  Vaping Use  . Vaping Use: Former  . Quit date: 10/09/2017  Substance Use Topics  . Alcohol use: Not Currently    Comment: occasionally  . Drug use: Yes    Types: Marijuana    Comment: 10-10-2019 last smoked 01/11/2019    Home Medications Prior to Admission medications   Medication Sig Start Date End Date Taking? Authorizing Provider  acetaminophen (TYLENOL) 325 MG tablet Take 2 tablets (650 mg total) by mouth every 4 (four) hours as needed (for pain scale < 4). 09/02/19   Huel Cote, MD  hydrocortisone (ANUSOL-HC) 2.5 % rectal cream Place 1 application rectally 2 (two) times daily. 09/19/19   Khatri, Hina, PA-C  ibuprofen (ADVIL) 600 MG tablet Take 1 tablet (600 mg total) by mouth every 6 (  six) hours. 09/02/19   Huel Cote, MD  Multiple Vitamin (MULTIVITAMIN WITH MINERALS) TABS tablet Take 1 tablet by mouth daily.    [provider]  polyethylene glycol (MIRALAX / GLYCOLAX) 17 g packet Take 17 g by mouth daily. Patient taking differently: Take 17 g by mouth daily as needed.  09/19/19   Dietrich Pates, PA-C    Allergies    Patient has no known allergies.  Review of Systems   Review of Systems  Constitutional: Negative for chills, diaphoresis, fatigue and fever.  HENT: Negative for congestion, rhinorrhea and sneezing.   Eyes: Negative.   Respiratory: Negative for cough, chest tightness and shortness of breath.   Cardiovascular: Negative for chest pain and leg swelling.  Gastrointestinal: Negative for abdominal pain, blood in stool, diarrhea, nausea and vomiting.  Genitourinary: Negative for difficulty urinating,  flank pain, frequency and hematuria.  Musculoskeletal: Negative for arthralgias and back pain.  Skin: Negative for rash.       Enlarged lymph nodes to her scalp  Neurological: Negative for dizziness, speech difficulty, weakness, numbness and headaches.    Physical Exam Updated Vital Signs BP (!) 137/96 (BP Location: Left Arm)   Pulse 83   Temp 97.6 F (36.4 C) (Oral)   Resp 15   Ht 5\' 4"  (1.626 m)   Wt 77.1 kg   SpO2 100%   BMI 29.18 kg/m   Physical Exam Constitutional:      Appearance: She is well-developed.  HENT:     Head: Normocephalic and atraumatic.     Right Ear: Tympanic membrane normal.     Left Ear: Tympanic membrane normal.     Ears:     Comments: Mildly enlarged periauricular lymph nodes bilaterally, there is no surrounding cellulitis.    Nose: Nose normal.     Mouth/Throat:     Mouth: Mucous membranes are moist.     Pharynx: No oropharyngeal exudate or posterior oropharyngeal erythema.  Eyes:     Pupils: Pupils are equal, round, and reactive to light.  Cardiovascular:     Rate and Rhythm: Normal rate and regular rhythm.     Heart sounds: Normal heart sounds.  Pulmonary:     Effort: Pulmonary effort is normal. No respiratory distress.     Breath sounds: Normal breath sounds. No wheezing or rales.  Chest:     Chest wall: No tenderness.  Abdominal:     General: Bowel sounds are normal.     Palpations: Abdomen is soft.     Tenderness: There is no abdominal tenderness. There is no guarding or rebound.  Musculoskeletal:        General: Normal range of motion.     Cervical back: Normal range of motion and neck supple.  Lymphadenopathy:     Cervical: No cervical adenopathy.  Skin:    General: Skin is warm and dry.     Findings: No rash.  Neurological:     Mental Status: She is alert and oriented to person, place, and time.     ED Results / Procedures / Treatments   Labs (all labs ordered are listed, but only abnormal results are displayed) Labs  Reviewed  CBC WITH DIFFERENTIAL/PLATELET    EKG None  Radiology No results found.  Procedures Procedures (including critical care time)  Medications Ordered in ED Medications - No data to display  ED Course  I have reviewed the triage vital signs and the nursing notes.  Pertinent labs & imaging results that were available during  my care of the patient were reviewed by me and considered in my medical decision making (see chart for details).    MDM Rules/Calculators/A&P                          Patient is a 29 year old female who presents with some enlarged lymph nodes in the periauricular area.  I do not see any suggestions of bacterial infection.  She is otherwise well-appearing.  She reported some mild fatigue.  Given this, I did check a CBC.  She has normal white count and normal hemoglobin.  She was discharged home in good condition.  Symptomatic care instructions were given.  She was encouraged to make an appointment to follow-up with her PCP for recheck.  Return precautions were given. Final Clinical Impression(s) / ED Diagnoses Final diagnoses:  Lymphadenopathy    Rx / DC Orders ED Discharge Orders    None       Rolan Bucco, MD 08/09/20 2242

## 2021-03-04 ENCOUNTER — Other Ambulatory Visit: Payer: Self-pay

## 2021-03-04 ENCOUNTER — Encounter: Payer: Self-pay | Admitting: Family Medicine

## 2021-03-04 ENCOUNTER — Ambulatory Visit: Payer: Medicaid Other | Admitting: Family Medicine

## 2021-03-04 VITALS — BP 112/74 | HR 76 | Ht 64.0 in | Wt 160.2 lb

## 2021-03-04 DIAGNOSIS — M546 Pain in thoracic spine: Secondary | ICD-10-CM | POA: Diagnosis present

## 2021-03-04 DIAGNOSIS — G8929 Other chronic pain: Secondary | ICD-10-CM | POA: Insufficient documentation

## 2021-03-04 NOTE — Progress Notes (Signed)
    SUBJECTIVE:   CHIEF COMPLAINT / HPI:  Back pain: severe pain, aches.  Has 'small case of scoliosis' diagnosed when she was a teenager..  Pain has been worse since she had her daughter which was 1.5 years ago.  Has worsened over the past few months but unsure what caused the worsening.  Pain is mostly in the middle and upper back between her shoulder blades.  Pain does not radiate down the arms or legs.  No numbness or tingling.  No weakness.  Not taking medications for right now.  Describes the pain as a dull aching pain.  Patient has been doing some basic stretching at home to help with this pain.  She wants to know if we can refer her to a chiropractor.  She has been a [place] in the past and this helped her.  Patient also complains of a knot in the back of her head behind her right ear in the hairline.  It was painful but she states it has resolved on its own.  Daughter 1.5 years, older 5 and 72.  Married.  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    PERTINENT  PMH / PSH: BTL.  No tobacco.  No drug use.  Occasional wine.  OBJECTIVE:   BP 112/74   Pulse 76   Ht 5\' 4"  (1.626 m)   Wt 160 lb 3.2 oz (72.7 kg)   LMP 02/18/2021 (Approximate)   BMI 27.50 kg/m   General: Alert and oriented.  No acute distress. CV: Regular rate and rhythm, no murmurs. Pulmonary: Lungs clear auscultation bilaterally MSK: Minimal tenderness in the lower trapezius muscles just lateral to the thoracic spine.  No thoracic spine tenderness.  Minimal scoliosis noted.  Normal range of motion.  Normal strength testing.  ASSESSMENT/PLAN:   Chronic bilateral thoracic back pain Chronic thoracic pain worsening over the past few months.  Appears to be muscular.  No radiculopathy.  Recommended patient to exercises, stretching.  Using Tylenol and heat for pain relief along with Voltaren gel.  Advised patient she can go to chiropractor if she felt like this is helped in the past, but we do not do referrals.  Advised her we could  refer her to physical therapy for help doing the exercises and stretches.  Patient will try to do them on her own first and then call 02/20/2021 if needed for referral.     [place], MD [facility]

## 2021-03-04 NOTE — Assessment & Plan Note (Signed)
Chronic thoracic pain worsening over the past few months.  Appears to be muscular.  No radiculopathy.  Recommended patient to exercises, stretching.  Using Tylenol and heat for pain relief along with Voltaren gel.  Advised patient she can go to chiropractor if she felt like this is helped in the past, but we do not do referrals.  Advised her we could refer her to physical therapy for help doing the exercises and stretches.  Patient will try to do them on her own first and then call us if needed for referral.

## 2021-03-04 NOTE — Patient Instructions (Signed)
It was nice to see you today,  For your back pain I have given you a list of some basic stretches and exercises you can do at home.  Try to do these every day at least once a day for 3 sets of 15.  Try to do the stretching for at least 30 seconds at a time and again 3-5 reps a day.  You can go to a chiropractor if that is helped in the past.  Referrals from [place] are not necessary.  Physical therapy is another option.  If you want to do physical therapy we can refer you for that.  Just call us and let us know.  As far as medications for pain relief, I would use Tylenol as needed and Voltaren gel as needed.  These are available over-the-counter.  You can also use applied heat to the area.  Massage is another treatment option.  Have a great day,  Frederic Jericho, MD

## 2021-10-20 ENCOUNTER — Other Ambulatory Visit: Payer: Self-pay

## 2021-10-20 ENCOUNTER — Ambulatory Visit (INDEPENDENT_AMBULATORY_CARE_PROVIDER_SITE_OTHER): Payer: 59 | Admitting: Family Medicine

## 2021-10-20 VITALS — BP 118/82 | HR 84 | Ht 64.0 in | Wt 150.8 lb

## 2021-10-20 DIAGNOSIS — Z1159 Encounter for screening for other viral diseases: Secondary | ICD-10-CM

## 2021-10-20 DIAGNOSIS — B351 Tinea unguium: Secondary | ICD-10-CM | POA: Diagnosis not present

## 2021-10-20 DIAGNOSIS — Z23 Encounter for immunization: Secondary | ICD-10-CM | POA: Diagnosis not present

## 2021-10-20 NOTE — Progress Notes (Signed)
° ° °  SUBJECTIVE:   CHIEF COMPLAINT / HPI:   Toe infection - Thickened flaky left great toenail x1 year - Progressive worsening - No pain, erythema, purulent drainage - No trauma or other injury - Over the past couple months the right great toe has now started to thicken as well - Patient inquires about removing the entire toenail   PERTINENT  PMH / PSH:  Patient Active Problem List   Diagnosis Date Noted   Fungal infection of toenail 10/20/2021   Need for Tdap vaccination 10/20/2021   Need for hepatitis C screening test 10/20/2021   Chronic bilateral thoracic back pain 03/04/2021   Status post bilateral salpingectomy 10/13/2019     OBJECTIVE:   BP 118/82    Pulse 84    Ht 5\' 4"  (1.626 m)    Wt 150 lb 12.8 oz (68.4 kg)    LMP 10/16/2021 (Exact Date)    SpO2 100%    BMI 25.88 kg/m   PHQ-9:  Depression screen [facility] 2/9 10/20/2021 03/04/2021  Decreased Interest 0 0  Down, Depressed, Hopeless 1 0  PHQ - 2 Score 1 0  Altered sleeping 0 0  Tired, decreased energy 0 0  Change in appetite 0 0  Feeling bad or failure about yourself  0 0  Trouble concentrating 0 0  Moving slowly or fidgety/restless 0 0  Suicidal thoughts 0 0  PHQ-9 Score 1 0  Difficult doing work/chores - Not difficult at all     GAD-7: No flowsheet data found.   Physical Exam General: Awake, alert, oriented, no acute distress Respiratory: Unlabored respirations, speaking in full sentences, no respiratory distress Extremities: Moving all extremities spontaneously, left great toe thickened and discolored     ASSESSMENT/PLAN:   Fungal infection of toenail New problem.  Bilateral great toes, left worse than right.  Will obtain CMP for liver function prior to prescribing.  Plan for terbinafine daily x3 months with LFTs at 6-week mark.  Discussed mechanical debridement and filing instead of toenail removal.  Will prescribe terbinafine as soon as CMP results.  Need for Tdap vaccination Due for Tdap at this time.   Administered, tolerated well.   Need for hepatitis C screening test Due for recommended one-time hep C screening.  Patient amenable, collected today as we were already collecting CMP. Will follow-up result.      05/04/2021, MD [facility]

## 2021-10-20 NOTE — Patient Instructions (Signed)
It was wonderful to meet you today. Thank you for allowing me to be a part of your care. Below is a short summary of what we discussed at your visit today:  Toe infection It looks like you have a fungal infection of your toenail.  It would be safest to not remove your toenail at this time. - Lab work today to establish good liver function prior to starting the medication - Take terbinafine daily for a total of 3 months - Come back in 6 weeks for repeat blood work to make sure the terbinafine is not stressing your liver - Use a nail file and nail tremors to grind down and trim back the affected toenail  Health Maintenance We like to think about ways to keep you healthy for years to come. Below are some interventions and screenings we can offer to keep you healthy: - COVID vaccine - Flu vaccine - TDaP vaccine (due every 10 years, protects from tetanus, diptheria, and whooping cough) - Hepatitis C screening (recommended once for every adult)   Please bring all of your medications to every appointment!  If you have any questions or concerns, please do not hesitate to contact us via phone or MyChart message.   Fayette Pho, MD

## 2021-10-20 NOTE — Assessment & Plan Note (Addendum)
New problem.  Bilateral great toes, left worse than right.  Will obtain CMP for liver function prior to prescribing.  Plan for terbinafine daily x3 months with LFTs at 6-week mark.  Discussed mechanical debridement and filing instead of toenail removal.  Will prescribe terbinafine as soon as CMP results.

## 2021-10-20 NOTE — Assessment & Plan Note (Signed)
Due for recommended one-time hep C screening.  Patient amenable, collected today as we were already collecting CMP. Will follow-up result.

## 2021-10-20 NOTE — Assessment & Plan Note (Signed)
Due for Tdap at this time.  Administered, tolerated well.

## 2021-10-21 LAB — COMPREHENSIVE METABOLIC PANEL
ALT: 10 IU/L (ref 0–32)
AST: 16 IU/L (ref 0–40)
Albumin/Globulin Ratio: 1.8 (ref 1.2–2.2)
Albumin: 4.3 g/dL (ref 3.9–5.0)
Alkaline Phosphatase: 81 IU/L (ref 44–121)
BUN/Creatinine Ratio: 4 — ABNORMAL LOW (ref 9–23)
BUN: 3 mg/dL — ABNORMAL LOW (ref 6–20)
Bilirubin Total: 0.3 mg/dL (ref 0.0–1.2)
CO2: 26 mmol/L (ref 20–29)
Calcium: 9.4 mg/dL (ref 8.7–10.2)
Chloride: 106 mmol/L (ref 96–106)
Creatinine, Ser: 0.84 mg/dL (ref 0.57–1.00)
Globulin, Total: 2.4 g/dL (ref 1.5–4.5)
Glucose: 77 mg/dL (ref 70–99)
Potassium: 4 mmol/L (ref 3.5–5.2)
Sodium: 144 mmol/L (ref 134–144)
Total Protein: 6.7 g/dL (ref 6.0–8.5)
eGFR: 97 mL/min/{1.73_m2} (ref >59)

## 2021-10-21 LAB — HCV INTERPRETATION

## 2021-10-21 LAB — HCV AB W REFLEX TO QUANT PCR: HCV Ab: <0.1 s/co ratio (ref 0.0–0.9)

## 2021-10-22 ENCOUNTER — Other Ambulatory Visit: Payer: Self-pay | Admitting: Family Medicine

## 2021-10-22 DIAGNOSIS — B351 Tinea unguium: Secondary | ICD-10-CM

## 2021-10-22 MED ORDER — TERBINAFINE HCL 250 MG PO TABS: 250.0000 mg | ORAL_TABLET | Freq: Every day | ORAL | 0 refills | Status: AC

## 2021-10-22 NOTE — Progress Notes (Signed)
Terbinafine for onychomycosis of toenail. LFTS wnl. Will recheck LFTs in 6 weeks. Future lab ordered.  Fayette Pho, MD

## 2021-12-17 ENCOUNTER — Other Ambulatory Visit: Payer: Medicaid Other

## 2021-12-17 ENCOUNTER — Other Ambulatory Visit: Payer: Self-pay

## 2021-12-17 DIAGNOSIS — B351 Tinea unguium: Secondary | ICD-10-CM

## 2021-12-18 LAB — HEPATIC FUNCTION PANEL
ALT: 11 IU/L (ref 0–32)
AST: 17 IU/L (ref 0–40)
Albumin: 4.6 g/dL (ref 3.9–5.0)
Alkaline Phosphatase: 92 IU/L (ref 44–121)
Bilirubin Total: 0.3 mg/dL (ref 0.0–1.2)
Bilirubin, Direct: 0.11 mg/dL (ref 0.00–0.40)
Total Protein: 7 g/dL (ref 6.0–8.5)

## 2022-03-18 ENCOUNTER — Encounter: Payer: Self-pay | Admitting: Family Medicine

## 2022-03-18 ENCOUNTER — Ambulatory Visit: Payer: Medicaid Other | Admitting: Family Medicine

## 2022-03-18 VITALS — BP 126/72 | HR 77 | Ht 64.0 in | Wt 150.4 lb

## 2022-03-18 DIAGNOSIS — B351 Tinea unguium: Secondary | ICD-10-CM

## 2022-03-18 MED ORDER — TERBINAFINE HCL 1 % EX CREA: 1.0000 "application " | TOPICAL_CREAM | Freq: Two times a day (BID) | CUTANEOUS | 0 refills | Status: DC

## 2022-03-18 NOTE — Progress Notes (Signed)
    SUBJECTIVE:   CHIEF COMPLAINT / HPI: blood work and fungal infection of feet  Patient reports needing blood work for fungal infection in her toes.  Reports she was told to have blood work every 6 weeks while on terbinafine.  Was prescribed terbinafine in December and reports having missed 1-2 doses weekly.  Last dose was yesterday.  Continues to have discoloration great toes bilaterally with itching.  Denies any pain, fevers.  Had pedicure yesterday.  Would like refill of medication and podiatry referral.  PERTINENT  PMH / PSH:  None  OBJECTIVE:   BP 126/72   Pulse 77   Ht 5\' 4"  (1.626 m)   Wt 150 lb 6.4 oz (68.2 kg)   LMP 03/14/2022 (Exact Date)   SpO2 100%   BMI 25.82 kg/m    General: Alert, no acute distress Feet: Thickened nails noted to great toes bilaterally.  Unable to appreciate discoloration given recent pedicure.   ASSESSMENT/PLAN:   Fungal infection of toenail Completed 73-month course of terbinafine over 44-months.  Continues to have thickened nails and itching. -CMET today -We will send clippings for fungal stain -Lamisil cream twice daily x14 days -Referral to podiatry -Follow-up with results     Carollee Leitz, MD [place]

## 2022-03-18 NOTE — Patient Instructions (Signed)
Thank you for coming to see me today. It was a pleasure.  ? ?We will get some labs today.  If they are abnormal or we need to do something about them, I will call you.  If they are normal, I will send you a message on MyChart (if it is active) or a letter in the mail.  If you don't hear from [place] in 2 weeks, please call the office at the number below.  ? ?Lamisil cream to affected area twice daily for 14 days ?Use antifungal spray in your shoes ? ?We will send referral to podiatry ? ? ?Please follow-up with PCP as needed ? ?If you have any questions or concerns, please do not hesitate to call the office at [phone]. ? ?Best,  ? ?Dana Allan, MD   ?

## 2022-03-19 LAB — COMPREHENSIVE METABOLIC PANEL
ALT: 17 IU/L (ref 0–32)
AST: 24 IU/L (ref 0–40)
Albumin/Globulin Ratio: 2 (ref 1.2–2.2)
Albumin: 4.3 g/dL (ref 3.9–5.0)
Alkaline Phosphatase: 81 IU/L (ref 44–121)
BUN/Creatinine Ratio: 6 — ABNORMAL LOW (ref 9–23)
BUN: 5 mg/dL — ABNORMAL LOW (ref 6–20)
Bilirubin Total: 0.2 mg/dL (ref 0.0–1.2)
CO2: 25 mmol/L (ref 20–29)
Calcium: 9 mg/dL (ref 8.7–10.2)
Chloride: 105 mmol/L (ref 96–106)
Creatinine, Ser: 0.8 mg/dL (ref 0.57–1.00)
Globulin, Total: 2.1 g/dL (ref 1.5–4.5)
Glucose: 70 mg/dL (ref 70–99)
Potassium: 4 mmol/L (ref 3.5–5.2)
Sodium: 143 mmol/L (ref 134–144)
Total Protein: 6.4 g/dL (ref 6.0–8.5)
eGFR: 103 mL/min/{1.73_m2} (ref >59)

## 2022-03-20 ENCOUNTER — Encounter: Payer: Self-pay | Admitting: Family Medicine

## 2022-03-22 ENCOUNTER — Encounter: Payer: Self-pay | Admitting: Family Medicine

## 2022-03-22 LAB — FUNGUS STAIN

## 2022-03-22 NOTE — Assessment & Plan Note (Signed)
Completed 76-month course of terbinafine over 15-months.  Continues to have thickened nails and itching. -CMET today -We will send clippings for fungal stain -Lamisil cream twice daily x14 days -Referral to podiatry -Follow-up with results

## 2022-04-03 ENCOUNTER — Encounter: Payer: Self-pay | Admitting: Podiatry

## 2022-04-03 ENCOUNTER — Ambulatory Visit (INDEPENDENT_AMBULATORY_CARE_PROVIDER_SITE_OTHER): Payer: Medicaid Other | Admitting: Podiatry

## 2022-04-03 DIAGNOSIS — B351 Tinea unguium: Secondary | ICD-10-CM | POA: Diagnosis not present

## 2022-04-05 NOTE — Progress Notes (Signed)
Subjective:   Patient ID: Ann Parsons, female   DOB: 31 y.o.   MRN: [id number]   HPI Patient presents stating that she has had discoloration in her nailbeds with family history of this and she is getting some irritation around the skin and seemed that that part of it occurred after pedicure and states that she has taken 3 months of oral antifungal without any changes and did not grow fungus on culture.  Patient does not smoke likes to be active   Review of Systems  All other systems reviewed and are negative.      Objective:  Physical Exam Vitals and nursing note reviewed.  Constitutional:      Appearance: She is well-developed.  Pulmonary:     Effort: Pulmonary effort is normal.  Musculoskeletal:        General: Normal range of motion.  Skin:    General: Skin is warm.  Neurological:     Mental Status: She is alert.    Neurovascular status found to be intact muscle strength found to be adequate range of motion adequate.  Patient does have thickness of the nailbeds 1-5 both feet and does have dry skin with irritation around the nailbeds mostly around the hallux with discoloration pattern noted.  Good digital perfusion well oriented x3     Assessment:  Probability that this is a hereditary type condition with subtle fungal condition or possible simply just dry skin and nail discoloration     Plan:  H&P reviewed condition.  She is already been oral antifungals I do not recommend that treatment and I did discuss some kind of steroid cream to try to help with any itch or other complex but she is not interested in this.  She is frustrated that we cannot make her nails better but I explained at the time that I see this now I do not see any potential to be able to do this.  Patient can also see a dermatologist or other doctor but I simply do not see a way to be able to improve the condition based on her failure to respond to previous treatment

## 2022-04-07 ENCOUNTER — Encounter: Payer: Self-pay | Admitting: *Deleted

## 2022-05-19 ENCOUNTER — Other Ambulatory Visit (HOSPITAL_COMMUNITY)
Admission: RE | Admit: 2022-05-19 | Discharge: 2022-05-19 | Disposition: A | Discharge status: Home / Self Care | Payer: Medicaid Other | Source: Ambulatory Visit | Attending: Family Medicine | Admitting: Family Medicine

## 2022-05-19 ENCOUNTER — Ambulatory Visit: Payer: Medicaid Other | Admitting: Family Medicine

## 2022-05-19 VITALS — BP 110/70 | HR 70 | Temp 97.8°F | Wt 152.2 lb

## 2022-05-19 DIAGNOSIS — Z124 Encounter for screening for malignant neoplasm of cervix: Secondary | ICD-10-CM

## 2022-05-19 DIAGNOSIS — N898 Other specified noninflammatory disorders of vagina: Secondary | ICD-10-CM | POA: Insufficient documentation

## 2022-05-19 DIAGNOSIS — N760 Acute vaginitis: Secondary | ICD-10-CM | POA: Diagnosis not present

## 2022-05-19 DIAGNOSIS — B9689 Other specified bacterial agents as the cause of diseases classified elsewhere: Secondary | ICD-10-CM

## 2022-05-19 LAB — POCT WET PREP (WET MOUNT)
Clue Cells Wet Prep Whiff POC: POSITIVE
Trichomonas Wet Prep HPF POC: ABSENT

## 2022-05-19 MED ORDER — METRONIDAZOLE 500 MG PO TABS: 500.0000 mg | ORAL_TABLET | Freq: Two times a day (BID) | ORAL | 0 refills | Status: AC

## 2022-05-19 NOTE — Assessment & Plan Note (Signed)
Assessment:  31 y.o. female with vaginal discharge for 3 weeks, as well as fishy odor.  Physical exam significant for yellow thin discharge.  Wet prep performed today positive for BV.  Patient declined STI screening.   Plan: -Wet prep as above.  Will treat with metronidazole 500 mg BID x 7 days. -Contraception: tubal ligation.  -Pap smear due this December. Performed today with HPV reflex.

## 2022-05-19 NOTE — Progress Notes (Signed)
    SUBJECTIVE:   CHIEF COMPLAINT / HPI:   Vaginal Discharge: Patient is a 31 y.o. female presenting with vaginal discharge for 3 weeks.  She states the discharge started off green, now appears dark yellow. There is a fishy smell to it. She is married to a female, monogamous relationship.  Denies any new recent sexual partners.  She is not interested in screening for STIs.  She has history of tubal ligation.   PERTINENT  PMH / PSH: Hx chlamydia, genital herpes  OBJECTIVE:   BP 110/70   Pulse 70   Temp 97.8 F (36.6 C)   Wt 152 lb 3.2 oz (69 kg)   LMP 05/02/2022 (Approximate)   SpO2 100%   BMI 26.13 kg/m    General: NAD, pleasant, able to participate in exam Respiratory: Normal effort, no obvious respiratory distress Pelvic: VULVA: normal appearing vulva with no masses, tenderness or lesions, VAGINA: Normal appearing vagina with normal color, no lesions, with yellow and thin discharge present CERVIX: No lesions, yellow and thin discharge present.  Chaperone Jake Seats CMA present for pelvic exam  ASSESSMENT/PLAN:   Vaginal discharge Assessment:  31 y.o. female with vaginal discharge for 3 weeks, as well as fishy odor.  Physical exam significant for yellow thin discharge.  Wet prep performed today positive for BV.  Patient declined STI screening.   Plan: -Wet prep as above.  Will treat with metronidazole 500 mg BID x 7 days. -Contraception: tubal ligation.  -Pap smear due this December. Performed today with HPV reflex.        Sabino Dick, DO [facility]

## 2022-05-19 NOTE — Patient Instructions (Signed)
It was wonderful to see you today.  Please bring ALL of your medications with you to every visit.   Today we talked about:  -We did a swab today to check for bacterial vaginosis and yeast. We also did your Pap smear. If normal, your next will be due in 3 years. I will send you a MyChart message if you have MyChart. Otherwise, I will give you a call for abnormal results or send a letter if everything returned back normal. If you don't hear from me in 2 weeks, please call the office.     Thank you for choosing [facility].   Please call [phone] with any questions about today's appointment.  Please be sure to schedule follow up at the front  desk before you leave today.   Sabino Dick, DO PGY-3 Family Medicine

## 2022-05-20 LAB — CYTOLOGY - PAP: Diagnosis: NEGATIVE

## 2022-08-30 ENCOUNTER — Other Ambulatory Visit: Payer: Self-pay

## 2022-08-30 ENCOUNTER — Emergency Department (HOSPITAL_COMMUNITY)
Admission: EM | Admit: 2022-08-30 | Discharge: 2022-08-30 | Disposition: A | Discharge status: Home / Self Care | Payer: Medicaid Other | Attending: Emergency Medicine | Admitting: Emergency Medicine

## 2022-08-30 ENCOUNTER — Emergency Department (HOSPITAL_COMMUNITY): Payer: Medicaid Other

## 2022-08-30 ENCOUNTER — Encounter (HOSPITAL_COMMUNITY): Payer: Self-pay

## 2022-08-30 DIAGNOSIS — S6992XA Unspecified injury of left wrist, hand and finger(s), initial encounter: Secondary | ICD-10-CM

## 2022-08-30 DIAGNOSIS — S61231A Puncture wound without foreign body of left index finger without damage to nail, initial encounter: Secondary | ICD-10-CM | POA: Insufficient documentation

## 2022-08-30 DIAGNOSIS — Y93D1 Activity, knitting and crocheting: Secondary | ICD-10-CM | POA: Insufficient documentation

## 2022-08-30 DIAGNOSIS — Z23 Encounter for immunization: Secondary | ICD-10-CM | POA: Diagnosis not present

## 2022-08-30 DIAGNOSIS — W273XXA Contact with needle (sewing), initial encounter: Secondary | ICD-10-CM | POA: Insufficient documentation

## 2022-08-30 MED ORDER — LIDOCAINE HCL (PF) 1 % IJ SOLN
5.0000 mL | Freq: Once | INTRAMUSCULAR | Status: AC
  Administered 2022-08-30: 5 mL
  Filled 2022-08-30: qty 30

## 2022-08-30 MED ORDER — TETANUS-DIPHTH-ACELL PERTUSSIS 5-2.5-18.5 LF-MCG/0.5 IM SUSY
0.5000 mL | PREFILLED_SYRINGE | Freq: Once | INTRAMUSCULAR | Status: AC
  Administered 2022-08-30: 0.5 mL via INTRAMUSCULAR
  Filled 2022-08-30: qty 0.5

## 2022-08-30 NOTE — ED Provider Triage Note (Signed)
Emergency Medicine Provider Triage Evaluation Note  Ann Parsons , a 31 y.o. female  was evaluated in triage.  Pt complains of pain to L index finger after she accidentally stabbed herself with a crochet needle while doing a family member's hair. Pain constant, unchanged. Tdap unknown.  Review of Systems  Positive: As above Negative: As above  Physical Exam  SpO2 100%  Gen:   Awake, no distress   Resp:  Normal effort  MSK:   Moves extremities without difficulty  Other:  FB to distal ulnar aspect of the L index finger  Medical Decision Making  Medically screening exam initiated at 7:58 PM.  Appropriate orders placed.  TERRILL WAUTERS was informed that the remainder of the evaluation will be completed by another provider, this initial triage assessment does not replace that evaluation, and the importance of remaining in the ED until their evaluation is complete.  Xray to assess FB placement. Tdap ordered.   Antonietta Breach, PA-C 08/30/22 1959

## 2022-08-30 NOTE — ED Triage Notes (Signed)
Pt has a 0.75mm hair crochet needle in the left index finger. A&O x4

## 2022-08-30 NOTE — Discharge Instructions (Signed)
Please take tylenol/ibuprofen for pain. I recommend close follow-up with your PCP for reevaluation.  Please do not hesitate to return to emergency department if worrisome signs symptoms we discussed become apparent.  

## 2022-08-30 NOTE — ED Provider Notes (Signed)
[facility]-EMERGENCY DEPT Provider Note   CSN: [id number] Arrival date & time: 08/30/22  1946     History {Add pertinent medical, surgical, social history, OB history to HPI:1} Chief Complaint  Patient presents with   Hand Injury    IVIONNA VERLEY is a 31 y.o. female with no significant past medical history presenting to the emergency department after accidentally stabbing a hair crochet needle in her left index finger.  The pain is constant.  Tdap unknown.  Denies fever, blood or fluid drainage from the wound.   Hand Injury      Home Medications Prior to Admission medications   Medication Sig Start Date End Date Taking? Authorizing Provider  acetaminophen (TYLENOL) 325 MG tablet Take 2 tablets (650 mg total) by mouth every 4 (four) hours as needed (for pain scale < 4). 09/02/19   Huel Cote, MD  ibuprofen (ADVIL) 600 MG tablet Take 1 tablet (600 mg total) by mouth every 6 (six) hours. 09/02/19   Huel Cote, MD  levonorgestrel (MIRENA, 52 MG,) 20 MCG/DAY IUD Mirena 21 mcg/24 hours (8 yrs) 52 mg intrauterine device  Take by intrauterine route. 09/01/13   [provider]  Multiple Vitamin (MULTIVITAMIN WITH MINERALS) TABS tablet Take 1 tablet by mouth daily.    [provider]      Allergies    Patient has no known allergies.    Review of Systems   Review of Systems  Physical Exam Updated Vital Signs BP (!) 135/100   Pulse 74   Temp 98.5 F (36.9 C) (Oral)   Resp 16   LMP 08/26/2022 (Exact Date)   SpO2 97%  Physical Exam  ED Results / Procedures / Treatments   Labs (all labs ordered are listed, but only abnormal results are displayed) Labs Reviewed - No data to display  EKG None  Radiology DG Finger Index Left  Result Date: 08/30/2022 CLINICAL DATA:  Needle stuck in index finger. EXAM: LEFT INDEX FINGER 2+V COMPARISON:  None Available. FINDINGS: Crochet needle is seen within the soft tissues of the  left index finger. No bony involvement. No fracture, subluxation or dislocation. IMPRESSION: Crochet needle within the soft tissues of the left index finger. Electronically Signed   By: Charlett Nose M.D.   On: 08/30/2022 20:21    Procedures Procedures  {Document cardiac monitor, telemetry assessment procedure when appropriate:1}  Medications Ordered in ED Medications  Tdap (BOOSTRIX) injection 0.5 mL (0.5 mLs Intramuscular Given 08/30/22 2017)  lidocaine (PF) (XYLOCAINE) 1 % injection 5 mL (5 mLs Infiltration Given 08/30/22 2017)    ED Course/ Medical Decision Making/ A&P                           Medical Decision Making  This patient presents to the ED for concern of ***, this involves an extensive number of treatment options, and is a complaint that carries with it a high risk of complications and morbidity.  The differential diagnosis includes *** Co morbidities that complicate the patient evaluation  See HPI Additional history obtained:  Additional history obtained from EMR External records from outside source obtained and reviewed  Lab Tests:  I Ordered, and personally interpreted labs.  The pertinent results include:   No leukocytosis noted.   No evidence of anemia.   Platelets within normal range.   No electrolyte abnormalities noted.   Renal function within normal limits.   No transaminitis noted.  UA significant  for no acute abnormalities. *** Imaging Studies ordered:  I ordered imaging studies including: ***  I independently visualized and interpreted imaging. I agree with the radiologist interpretation Cardiac Monitoring: / EKG:  The patient was maintained on a cardiac monitor.  I personally viewed and interpreted the cardiac monitored which showed an underlying rhythm of: sinus rhythm Consultations Obtained:  I requested consultation with the ***,  and discussed lab and imaging findings as well as pertinent plan - they recommend: *** Problem List / ED Course /  Critical interventions / Medication management  HPI: see above Vitals signs within normal range and stable throughout visit. Laboratory/imaging studies significant for: See above On physical examination, patient is afebrile and appears in no acute distress. Patient's presentations are most concerned for ***. Low suspicion for ***. I ordered medication including ***  Reevaluation of the patient after these medicines showed that the patient {resolved/improved/worsened:23923::"improved"} I have reviewed the patients home medicines and have made adjustments as needed Social Determinants of Health:  N/A Test / Admission / Dispo - Considered:  Continued outpatient therapy. Follow-up with PCP *** recommended for reevaluation of symptoms. Treatment plan discussed with patient.  Pt acknowledged understanding was agreeable to the plan. Worrisome signs and symptoms were discussed with patient, and patient acknowledged understanding to return to the ED if they noticed these signs and symptoms. Patient was stable upon discharge.    {Document critical care time when appropriate:1} {Document review of labs and clinical decision tools ie heart score, Chads2Vasc2 etc:1}  {Document your independent review of radiology images, and any outside records:1} {Document your discussion with family members, caretakers, and with consultants:1} {Document social determinants of health affecting pt's care:1} {Document your decision making why or why not admission, treatments were needed:1} Final Clinical Impression(s) / ED Diagnoses Final diagnoses:  Injury of finger of left hand, initial encounter    Rx / DC Orders ED Discharge Orders     None

## 2023-11-18 ENCOUNTER — Other Ambulatory Visit: Payer: Self-pay | Admitting: Physician Assistant

## 2023-11-18 DIAGNOSIS — R52 Pain, unspecified: Secondary | ICD-10-CM

## 2023-12-13 ENCOUNTER — Ambulatory Visit
Admission: RE | Admit: 2023-12-13 | Discharge: 2023-12-13 | Disposition: A | Discharge status: Home / Self Care | Payer: No Typology Code available for payment source | Source: Ambulatory Visit | Attending: Physician Assistant | Admitting: Physician Assistant

## 2023-12-13 ENCOUNTER — Ambulatory Visit
Admission: RE | Admit: 2023-12-13 | Discharge: 2023-12-13 | Disposition: A | Discharge status: Home / Self Care | Payer: Medicaid Other | Source: Ambulatory Visit | Attending: Physician Assistant | Admitting: Physician Assistant

## 2023-12-13 DIAGNOSIS — R52 Pain, unspecified: Secondary | ICD-10-CM

## 2023-12-25 ENCOUNTER — Encounter (HOSPITAL_BASED_OUTPATIENT_CLINIC_OR_DEPARTMENT_OTHER): Payer: Self-pay | Admitting: Emergency Medicine

## 2023-12-25 ENCOUNTER — Emergency Department (HOSPITAL_BASED_OUTPATIENT_CLINIC_OR_DEPARTMENT_OTHER)
Admission: EM | Admit: 2023-12-25 | Discharge: 2023-12-26 | Disposition: A | Discharge status: Home / Self Care | Payer: No Typology Code available for payment source | Attending: Emergency Medicine | Admitting: Emergency Medicine

## 2023-12-25 DIAGNOSIS — J029 Acute pharyngitis, unspecified: Secondary | ICD-10-CM | POA: Diagnosis present

## 2023-12-25 DIAGNOSIS — R112 Nausea with vomiting, unspecified: Secondary | ICD-10-CM | POA: Diagnosis not present

## 2023-12-25 DIAGNOSIS — B349 Viral infection, unspecified: Secondary | ICD-10-CM | POA: Insufficient documentation

## 2023-12-25 LAB — HCG, SERUM, QUALITATIVE: Preg, Serum: NEGATIVE

## 2023-12-25 LAB — RESP PANEL BY RT-PCR (RSV, FLU A&B, COVID)  RVPGX2
Influenza A by PCR: NEGATIVE
Influenza B by PCR: NEGATIVE
Resp Syncytial Virus by PCR: NEGATIVE
SARS Coronavirus 2 by RT PCR: NEGATIVE

## 2023-12-25 LAB — GROUP A STREP BY PCR: Group A Strep by PCR: NOT DETECTED

## 2023-12-25 MED ORDER — SODIUM CHLORIDE 0.9 % IV BOLUS
1000.0000 mL | Freq: Once | INTRAVENOUS | Status: AC
  Administered 2023-12-25: 1000 mL via INTRAVENOUS

## 2023-12-25 MED ORDER — ONDANSETRON HCL 4 MG PO TABS: 4.0000 mg | ORAL_TABLET | Freq: Three times a day (TID) | ORAL | Status: DC | PRN

## 2023-12-25 MED ORDER — IBUPROFEN 400 MG PO TABS
600.0000 mg | ORAL_TABLET | Freq: Once | ORAL | Status: AC
  Administered 2023-12-25: 600 mg via ORAL
  Filled 2023-12-25: qty 1

## 2023-12-25 MED ORDER — ONDANSETRON HCL 4 MG PO TABS: 4.0000 mg | ORAL_TABLET | Freq: Three times a day (TID) | ORAL | 0 refills | Status: AC | PRN

## 2023-12-25 MED ORDER — ACETAMINOPHEN 500 MG PO TABS
1000.0000 mg | ORAL_TABLET | Freq: Once | ORAL | Status: AC
  Administered 2023-12-25: 1000 mg via ORAL
  Filled 2023-12-25: qty 2

## 2023-12-25 MED ORDER — ACETAMINOPHEN 160 MG/5ML PO SOLN
650.0000 mg | Freq: Once | ORAL | Status: AC
  Administered 2023-12-25: 650 mg via ORAL
  Filled 2023-12-25: qty 20.3

## 2023-12-25 MED ORDER — ONDANSETRON 4 MG PO TBDP
4.0000 mg | ORAL_TABLET | Freq: Once | ORAL | Status: AC
  Administered 2023-12-25: 4 mg via ORAL
  Filled 2023-12-25: qty 1

## 2023-12-25 NOTE — ED Notes (Signed)
 Pt vomiting in triage after strep swab

## 2023-12-25 NOTE — Discharge Instructions (Addendum)
 Your COVID influenza RSV test were all negative.  Your strep throat test was negative. This is most likely being caused by another virus As discussed, take Tylenol Motrin as directed for fevers and discomfort .  The prescribed Zofran from your pharmacy and take as directed for nausea vomiting Return to the emergency department for trouble breathing or any other concerns Otherwise follow-up with your primary care doctor in 1 week for reevaluation

## 2023-12-25 NOTE — ED Provider Notes (Signed)
 [place] EMERGENCY DEPARTMENT AT [facility] Provider Note   CSN: [id number] Arrival date & time: 12/25/23  1827     History  Chief Complaint  Patient presents with   Sore Throat    Ann Parsons is a 33 y.o. female.  Who presents to the ED for viral syndrome.  Sore throat generalized weakness nausea vomiting and lymphadenopathy for 1 week.  No fevers or GI symptoms.   Sore Throat       Home Medications Prior to Admission medications   Medication Sig Start Date End Date Taking? Authorizing Provider  ondansetron (ZOFRAN) 4 MG tablet Take 1 tablet (4 mg total) by mouth every 8 (eight) hours as needed for nausea or vomiting. 12/25/23  Yes Royanne Foots, DO  acetaminophen (TYLENOL) 325 MG tablet Take 2 tablets (650 mg total) by mouth every 4 (four) hours as needed (for pain scale < 4). 09/02/19   Huel Cote, MD  ibuprofen (ADVIL) 600 MG tablet Take 1 tablet (600 mg total) by mouth every 6 (six) hours. 09/02/19   Huel Cote, MD  levonorgestrel (MIRENA, 52 MG,) 20 MCG/DAY IUD Mirena 21 mcg/24 hours (8 yrs) 52 mg intrauterine device  Take by intrauterine route. 09/01/13   [provider]  Multiple Vitamin (MULTIVITAMIN WITH MINERALS) TABS tablet Take 1 tablet by mouth daily.    [provider]      Allergies    Patient has no known allergies.    Review of Systems   Review of Systems  Physical Exam Updated Vital Signs BP (!) 144/99   Pulse 86   Temp 98.8 F (37.1 C)   Resp 18   LMP 12/11/2023   SpO2 100%  Physical Exam Vitals and nursing note reviewed.  HENT:     Head: Normocephalic and atraumatic.     Mouth/Throat:     Pharynx: Oropharyngeal exudate and posterior oropharyngeal erythema present.  Eyes:     Pupils: Pupils are equal, round, and reactive to light.  Cardiovascular:     Rate and Rhythm: Normal rate and regular rhythm.  Pulmonary:     Effort: Pulmonary effort is normal.     Breath sounds: Normal breath  sounds.  Abdominal:     Palpations: Abdomen is soft.     Tenderness: There is no abdominal tenderness.  Skin:    General: Skin is warm and dry.  Neurological:     Mental Status: She is alert.  Psychiatric:        Mood and Affect: Mood normal.     ED Results / Procedures / Treatments   Labs (all labs ordered are listed, but only abnormal results are displayed) Labs Reviewed  GROUP A STREP BY PCR  RESP PANEL BY RT-PCR (RSV, FLU A&B, COVID)  RVPGX2  HCG, SERUM, QUALITATIVE    EKG None  Radiology No results found.  Procedures Procedures    Medications Ordered in ED Medications  acetaminophen (TYLENOL) 160 MG/5ML solution 650 mg (650 mg Oral See Procedure Record 12/25/23 2009)  ondansetron (ZOFRAN-ODT) disintegrating tablet 4 mg (4 mg Oral Given 12/25/23 1843)  ibuprofen (ADVIL) tablet 600 mg (600 mg Oral Given 12/25/23 2019)  acetaminophen (TYLENOL) tablet 1,000 mg (1,000 mg Oral Given 12/25/23 2022)  sodium chloride 0.9 % bolus 1,000 mL (0 mLs Intravenous Stopped 12/25/23 2130)    ED Course/ Medical Decision Making/ A&P  Medical Decision Making Otherwise healthy 34 year old female presenting for viral syndrome.  Exudate noted on exam.  Afebrile here.  Suspect viral illness.  Strep COVID influenza RSV all negative.  Pregnancy negative.  She feels much better after fluids Tylenol Motrin.  Instructed on symptomatic management of viral symptoms.  Will prescribe Zofran for nausea vomiting.  She will follow-up with her PCP  Amount and/or Complexity of Data Reviewed Labs: ordered.  Risk OTC drugs. Prescription drug management.           Final Clinical Impression(s) / ED Diagnoses Final diagnoses:  Viral syndrome  Nausea and vomiting, unspecified vomiting type    Rx / DC Orders ED Discharge Orders          Ordered    ondansetron (ZOFRAN) 4 MG tablet  Every 8 hours PRN        12/25/23 2352              Royanne Foots,  DO 12/25/23 2353

## 2023-12-25 NOTE — ED Triage Notes (Signed)
 Pt reports "lymph nodes swollen", sore throat, mainly LT side and weakness x 1 week pta

## 2024-04-11 ENCOUNTER — Encounter: Payer: Self-pay | Admitting: *Deleted

## 2024-08-07 ENCOUNTER — Emergency Department (HOSPITAL_BASED_OUTPATIENT_CLINIC_OR_DEPARTMENT_OTHER)
Admission: EM | Admit: 2024-08-07 | Discharge: 2024-08-07 | Disposition: A | Discharge status: Home / Self Care | Attending: Emergency Medicine | Admitting: Emergency Medicine

## 2024-08-07 ENCOUNTER — Other Ambulatory Visit: Payer: Self-pay

## 2024-08-07 DIAGNOSIS — B349 Viral infection, unspecified: Secondary | ICD-10-CM | POA: Diagnosis not present

## 2024-08-07 DIAGNOSIS — R112 Nausea with vomiting, unspecified: Secondary | ICD-10-CM

## 2024-08-07 DIAGNOSIS — E876 Hypokalemia: Secondary | ICD-10-CM | POA: Diagnosis not present

## 2024-08-07 LAB — URINALYSIS, ROUTINE W REFLEX MICROSCOPIC
Bacteria, UA: NONE SEEN
Bilirubin Urine: NEGATIVE
Glucose, UA: NEGATIVE mg/dL
Hgb urine dipstick: NEGATIVE
Ketones, ur: >80 mg/dL — AB
Leukocytes,Ua: NEGATIVE
Nitrite: NEGATIVE
Protein, ur: 30 mg/dL — AB
Specific Gravity, Urine: 1.037 — ABNORMAL HIGH (ref 1.005–1.030)
pH: 8 (ref 5.0–8.0)

## 2024-08-07 LAB — CBC
HCT: 37 % (ref 36.0–46.0)
Hemoglobin: 12.9 g/dL (ref 12.0–15.0)
MCH: 32.4 pg (ref 26.0–34.0)
MCHC: 34.9 g/dL (ref 30.0–36.0)
MCV: 93 fL (ref 80.0–100.0)
Platelets: 251 K/uL (ref 150–400)
RBC: 3.98 MIL/uL (ref 3.87–5.11)
RDW: 11.2 % — ABNORMAL LOW (ref 11.5–15.5)
WBC: 8.1 K/uL (ref 4.0–10.5)
nRBC: 0 % (ref 0.0–0.2)

## 2024-08-07 LAB — COMPREHENSIVE METABOLIC PANEL WITH GFR
ALT: 29 U/L (ref 0–44)
AST: 38 U/L (ref 15–41)
Albumin: 4.4 g/dL (ref 3.5–5.0)
Alkaline Phosphatase: 95 U/L (ref 38–126)
Anion gap: 16 — ABNORMAL HIGH (ref 5–15)
BUN: 7 mg/dL (ref 6–20)
CO2: 22 mmol/L (ref 22–32)
Calcium: 10 mg/dL (ref 8.9–10.3)
Chloride: 99 mmol/L (ref 98–111)
Creatinine, Ser: 0.93 mg/dL (ref 0.44–1.00)
GFR, Estimated: >60 mL/min (ref >60)
Glucose, Bld: 134 mg/dL — ABNORMAL HIGH (ref 70–99)
Potassium: 2.9 mmol/L — ABNORMAL LOW (ref 3.5–5.1)
Sodium: 137 mmol/L (ref 135–145)
Total Bilirubin: 0.5 mg/dL (ref 0.0–1.2)
Total Protein: 7.8 g/dL (ref 6.5–8.1)

## 2024-08-07 LAB — HCG, SERUM, QUALITATIVE: Preg, Serum: NEGATIVE

## 2024-08-07 LAB — RESP PANEL BY RT-PCR (RSV, FLU A&B, COVID)  RVPGX2
Influenza A by PCR: NEGATIVE
Influenza B by PCR: NEGATIVE
Resp Syncytial Virus by PCR: NEGATIVE
SARS Coronavirus 2 by RT PCR: NEGATIVE

## 2024-08-07 LAB — LIPASE, BLOOD: Lipase: 18 U/L (ref 11–51)

## 2024-08-07 LAB — GROUP A STREP BY PCR: Group A Strep by PCR: NOT DETECTED

## 2024-08-07 MED ORDER — ONDANSETRON HCL 4 MG PO TABS: 4.0000 mg | ORAL_TABLET | Freq: Three times a day (TID) | ORAL | 0 refills | Status: DC | PRN

## 2024-08-07 MED ORDER — IBUPROFEN 800 MG PO TABS
800.0000 mg | ORAL_TABLET | Freq: Once | ORAL | Status: AC
  Administered 2024-08-07: 800 mg via ORAL
  Filled 2024-08-07: qty 1

## 2024-08-07 MED ORDER — POTASSIUM CHLORIDE CRYS ER 20 MEQ PO TBCR: 20.0000 meq | EXTENDED_RELEASE_TABLET | Freq: Two times a day (BID) | ORAL | 0 refills | Status: DC

## 2024-08-07 MED ORDER — POTASSIUM CHLORIDE CRYS ER 20 MEQ PO TBCR
40.0000 meq | EXTENDED_RELEASE_TABLET | Freq: Once | ORAL | Status: AC
  Administered 2024-08-07: 40 meq via ORAL
  Filled 2024-08-07: qty 2

## 2024-08-07 MED ORDER — IBUPROFEN 800 MG PO TABS: 800.0000 mg | ORAL_TABLET | Freq: Three times a day (TID) | ORAL | 0 refills | Status: AC

## 2024-08-07 MED ORDER — ONDANSETRON HCL 4 MG/2ML IJ SOLN
4.0000 mg | Freq: Once | INTRAMUSCULAR | Status: AC | PRN
  Administered 2024-08-07: 4 mg via INTRAVENOUS
  Filled 2024-08-07: qty 2

## 2024-08-07 NOTE — ED Notes (Signed)
 Patient notified of need for urine sample to complete eval/assessment. Specimen cup provided and instructions for clean catch given. Patient will notify staff when able to provide

## 2024-08-07 NOTE — ED Triage Notes (Signed)
 HA, chills, emesis started last night. Denies urinary changes. Afebrile.

## 2024-08-07 NOTE — Discharge Instructions (Addendum)
Continue frequent small sips (10-20 ml) of clear liquids every 5-10 minutes. Gatorade or powerade are good options. Avoid milk, orange juice, and grape juice for now. . Once you have not had further vomiting with the small sips for 4 hours, you may begin to drink larger volumes of fluids at a time and try a bland diet which may include saltine crackers, applesauce, breads, pastas, bananas, bland chicken. If you continues to vomit despite medication, return to the ED for repeat evaluation. Otherwise, follow up with your doctor in 2-3 days for a re-check.  

## 2024-08-07 NOTE — ED Notes (Signed)
Patient was given something to eat and drink  

## 2024-08-07 NOTE — ED Provider Notes (Signed)
 [place] EMERGENCY DEPARTMENT AT [facility] Provider Note   CSN: [id number] Arrival date & time: 08/07/24  1746     Patient presents with: Emesis   Ann Parsons is a 33 y.o. female who presents the emergency department with a chief complaint of nausea vomiting and shaking chills.  Patient had onset of symptoms yesterday.  She had sore throat preceding for several days along with swollen lymph nodes in her neck that were sore.  She denies a cough.  She does not have any contacts the same symptoms.  She was having some abdominal wall pain from vomiting earlier which is now resolved after ibuprofen  and Zofran .  She denies any urinary symptoms diarrhea or cough.    Emesis      Prior to Admission medications   Medication Sig Start Date End Date Taking? Authorizing Provider  acetaminophen  (TYLENOL ) 325 MG tablet Take 2 tablets (650 mg total) by mouth every 4 (four) hours as needed (for pain scale < 4). 09/02/19   Estelle Service, MD  ibuprofen  (ADVIL ) 600 MG tablet Take 1 tablet (600 mg total) by mouth every 6 (six) hours. 09/02/19   Estelle Service, MD  levonorgestrel (MIRENA, 52 MG,) 20 MCG/DAY IUD Mirena 21 mcg/24 hours (8 yrs) 52 mg intrauterine device  Take by intrauterine route. 09/01/13   [provider]  Multiple Vitamin (MULTIVITAMIN WITH MINERALS) TABS tablet Take 1 tablet by mouth daily.    [provider]    Allergies: Patient has no known allergies.    Review of Systems  Gastrointestinal:  Positive for vomiting.    Updated Vital Signs BP (!) 131/92   Pulse 90   Temp (!) 100.8 F (38.2 C) (Oral)   Resp 20   SpO2 100%   Physical Exam Vitals and nursing note reviewed.  Constitutional:      General: She is not in acute distress.    Appearance: She is well-developed. She is not diaphoretic.  HENT:     Head: Normocephalic and atraumatic.     Right Ear: External ear normal.     Left Ear: External ear normal.     Nose: Nose  normal.     Mouth/Throat:     Mouth: Mucous membranes are moist.     Pharynx: Posterior oropharyngeal erythema present. No oropharyngeal exudate.  Eyes:     General: No scleral icterus.    Conjunctiva/sclera: Conjunctivae normal.  Cardiovascular:     Rate and Rhythm: Normal rate and regular rhythm.     Heart sounds: Normal heart sounds. No murmur heard.    No friction rub. No gallop.  Pulmonary:     Effort: Pulmonary effort is normal. No respiratory distress.     Breath sounds: Normal breath sounds.  Abdominal:     General: Bowel sounds are normal. There is no distension.     Palpations: Abdomen is soft. There is no mass.     Tenderness: There is no abdominal tenderness. There is no guarding.     Hernia: No hernia is present.  Musculoskeletal:     Cervical back: Normal range of motion.  Skin:    General: Skin is warm and dry.  Neurological:     Mental Status: She is alert and oriented to person, place, and time.  Psychiatric:        Behavior: Behavior normal.     (all labs ordered are listed, but only abnormal results are displayed) Labs Reviewed  COMPREHENSIVE METABOLIC PANEL WITH GFR - Abnormal; Notable  for the following components:      Result Value   Potassium 2.9 (*)    Glucose, Bld 134 (*)    Anion gap 16 (*)    All other components within normal limits  CBC - Abnormal; Notable for the following components:   RDW 11.2 (*)    All other components within normal limits  RESP PANEL BY RT-PCR (RSV, FLU A&B, COVID)  RVPGX2  LIPASE, BLOOD  HCG, SERUM, QUALITATIVE  URINALYSIS, ROUTINE W REFLEX MICROSCOPIC    EKG: None  Radiology: No results found.   Procedures   Medications Ordered in the ED  ondansetron  (ZOFRAN ) injection 4 mg (4 mg Intravenous Given 08/07/24 1759)    Clinical Course as of 08/07/24 2223  Mon Aug 07, 2024  1929 Potassium(!): 2.9 [AH]  2214 Ketones, ur(!): >80 [AH]  2215 Protein(!): 30 [AH]    Clinical Course User Index [AH] Arloa Chroman, PA-C                                 Medical Decision Making Amount and/or Complexity of Data Reviewed Labs: ordered. Decision-making details documented in ED Course.  Risk Prescription drug management.   This patient presents to the ED for concern of vomiting, nausea, fever, this involves an extensive number of treatment options, and is a complaint that carries with it a high risk of complications and morbidity.  The emergent differential diagnosis for vomiting includes, but is not limited to ACS/MI, DKA, Ischemic bowel, Meningitis, Sepsis, Acute gastric dilation, Adrenal insufficiency, Appendicitis,  Bowel obstruction/ileus, Carbon monoxide poisoning, Cholecystitis, Electrolyte abnormalities, Elevated ICP, Gastric outlet obstruction, Pancreatitis, Ruptured viscus, Biliary colic, Cannabinoid hyperemesis syndrome, Gastritis, Gastroenteritis, Gastroparesis,  Narcotic withdrawal, Peptic ulcer disease, and UTI   Co morbidities:   has a past medical history of Breakthrough bleeding with IUD (10/05/2014), Cervical cancer screening (10/25/2019), Elevated blood pressure, situational, Encounter to establish care (10/25/2019), History of chlamydia, History of group B Streptococcus (GBS) infection, Scoliosis, and Status post bilateral salpingectomy (10/13/2019).     Social Determinants of Health:   SDOH Screenings   Depression (PHQ2-9): Low Risk  (05/19/2022)  Tobacco Use: Low Risk  (12/25/2023)     Additional history:  {Additional history obtained from review of EMR  Lab Tests:  I Ordered, and personally interpreted labs.  The pertinent results include:   Labs reviewed.  CMP shows mild hypokalemia.  Repleted orally. Urine shows ketones no evidence of infection likely due to poor oral intake.  CBC unremarkable.      Medicines ordered and prescription drug management:  I ordered medication including  Medications  ondansetron  (ZOFRAN ) injection 4 mg (4 mg Intravenous Given  08/07/24 1759)  ibuprofen  (ADVIL ) tablet 800 mg (800 mg Oral Given 08/07/24 1946)  potassium chloride  SA (KLOR-CON  M) CR tablet 40 mEq (40 mEq Oral Given 08/07/24 2018)   for nausea borderline fever Reevaluation of the patient after these medicines showed that the patient improved I have reviewed the patients home medicines and have made adjustments as needed  Test Considered:   considered CT imaging of the abdomen but she has a benign abdominal exam and no tenderness to palpation at all.  Critical Interventions:    Consultations Obtained:   Problem List / ED Course:     ICD-10-CM   1. Viral illness  B34.9     2. Nausea and vomiting, unspecified vomiting type  R11.2     3.  Hypokalemia  E87.6       MDM: Patient here with likely viral syndrome with associated nausea and vomiting, she has mild hypokalemia and was given oral repletion and patient also given fluids which she tolerated along with food due to her urine ketones.  Patient feeling much better.  She we discharged with ibuprofen , Zofran  and these of twice daily 20 mEq potassium.  Return precautions discussed.   Dispostion:  After consideration of the diagnostic results and the patients response to treatment, I feel that the patent would benefit from discharge with symptomatic treatment.      Final diagnoses:  None    ED Discharge Orders     None          Arloa Chroman, PA-C 08/07/24 2231    Lenor Hollering, MD 08/07/24 2357

## 2024-08-08 ENCOUNTER — Telehealth (HOSPITAL_BASED_OUTPATIENT_CLINIC_OR_DEPARTMENT_OTHER): Payer: Self-pay | Admitting: Emergency Medicine

## 2024-08-08 MED ORDER — POTASSIUM CHLORIDE CRYS ER 20 MEQ PO TBCR: 20.0000 meq | EXTENDED_RELEASE_TABLET | Freq: Two times a day (BID) | ORAL | 0 refills | Status: AC

## 2024-08-08 MED ORDER — ONDANSETRON HCL 4 MG PO TABS: 4.0000 mg | ORAL_TABLET | Freq: Three times a day (TID) | ORAL | 0 refills | Status: AC | PRN

## 2024-08-08 NOTE — Telephone Encounter (Signed)
 Med sent to new pharmacy
